# Patient Record
Sex: Female | Born: 1980 | Race: Black or African American | Hispanic: No | Marital: Single | Smoking: Former smoker
Health system: Southern US, Community
[De-identification: ages and names within clinical notes are randomized; demographics above are authoritative.]

## PROBLEM LIST (undated history)

## (undated) ENCOUNTER — Inpatient Hospital Stay (HOSPITAL_COMMUNITY): Payer: Self-pay

## (undated) DIAGNOSIS — A5901 Trichomonal vulvovaginitis: Secondary | ICD-10-CM

## (undated) DIAGNOSIS — N390 Urinary tract infection, site not specified: Secondary | ICD-10-CM

## (undated) DIAGNOSIS — D649 Anemia, unspecified: Secondary | ICD-10-CM

## (undated) DIAGNOSIS — R87619 Unspecified abnormal cytological findings in specimens from cervix uteri: Secondary | ICD-10-CM

## (undated) DIAGNOSIS — IMO0002 Reserved for concepts with insufficient information to code with codable children: Secondary | ICD-10-CM

## (undated) DIAGNOSIS — A749 Chlamydial infection, unspecified: Secondary | ICD-10-CM

## (undated) DIAGNOSIS — O24419 Gestational diabetes mellitus in pregnancy, unspecified control: Secondary | ICD-10-CM

## (undated) HISTORY — PX: WISDOM TOOTH EXTRACTION: SHX21

## (undated) HISTORY — PX: INDUCED ABORTION: SHX677

---

## 1998-07-12 ENCOUNTER — Emergency Department (HOSPITAL_COMMUNITY): Admission: EM | Admit: 1998-07-12 | Discharge: 1998-07-12 | Payer: Self-pay | Admitting: Emergency Medicine

## 2002-09-14 ENCOUNTER — Emergency Department (HOSPITAL_COMMUNITY): Admission: EM | Admit: 2002-09-14 | Discharge: 2002-09-14 | Payer: Self-pay | Admitting: *Deleted

## 2003-05-07 ENCOUNTER — Other Ambulatory Visit: Admission: RE | Admit: 2003-05-07 | Discharge: 2003-05-07 | Payer: Self-pay | Admitting: Obstetrics and Gynecology

## 2003-05-07 ENCOUNTER — Other Ambulatory Visit: Admission: RE | Admit: 2003-05-07 | Discharge: 2003-05-07 | Payer: Self-pay | Admitting: *Deleted

## 2003-09-24 ENCOUNTER — Encounter: Admission: RE | Admit: 2003-09-24 | Discharge: 2003-09-24 | Payer: Self-pay | Admitting: *Deleted

## 2003-10-03 ENCOUNTER — Inpatient Hospital Stay (HOSPITAL_COMMUNITY): Admission: AD | Admit: 2003-10-03 | Discharge: 2003-10-03 | Payer: Self-pay | Admitting: Obstetrics and Gynecology

## 2003-11-08 ENCOUNTER — Inpatient Hospital Stay (HOSPITAL_COMMUNITY): Admission: AD | Admit: 2003-11-08 | Discharge: 2003-11-12 | Payer: Self-pay | Admitting: Obstetrics & Gynecology

## 2003-12-10 ENCOUNTER — Other Ambulatory Visit: Admission: RE | Admit: 2003-12-10 | Discharge: 2003-12-10 | Payer: Self-pay | Admitting: *Deleted

## 2005-12-08 ENCOUNTER — Inpatient Hospital Stay (HOSPITAL_COMMUNITY): Admission: AD | Admit: 2005-12-08 | Discharge: 2005-12-08 | Payer: Self-pay

## 2006-10-22 ENCOUNTER — Inpatient Hospital Stay (HOSPITAL_COMMUNITY): Admission: AD | Admit: 2006-10-22 | Discharge: 2006-10-22 | Payer: Self-pay | Admitting: Gynecology

## 2008-10-17 ENCOUNTER — Inpatient Hospital Stay (HOSPITAL_COMMUNITY): Admission: AD | Admit: 2008-10-17 | Discharge: 2008-10-17 | Payer: Self-pay | Admitting: Obstetrics and Gynecology

## 2008-10-29 ENCOUNTER — Ambulatory Visit: Admission: RE | Admit: 2008-10-29 | Discharge: 2008-10-29 | Payer: Self-pay | Admitting: Obstetrics and Gynecology

## 2008-11-06 ENCOUNTER — Ambulatory Visit (HOSPITAL_COMMUNITY): Admission: RE | Admit: 2008-11-06 | Discharge: 2008-11-06 | Payer: Self-pay | Admitting: Obstetrics and Gynecology

## 2008-11-12 ENCOUNTER — Inpatient Hospital Stay (HOSPITAL_COMMUNITY): Admission: RE | Admit: 2008-11-12 | Discharge: 2008-11-14 | Payer: Self-pay | Admitting: Obstetrics and Gynecology

## 2008-11-12 ENCOUNTER — Encounter (INDEPENDENT_AMBULATORY_CARE_PROVIDER_SITE_OTHER): Payer: Self-pay | Admitting: Obstetrics and Gynecology

## 2009-02-02 ENCOUNTER — Emergency Department (HOSPITAL_COMMUNITY): Admission: EM | Admit: 2009-02-02 | Discharge: 2009-02-02 | Payer: Self-pay | Admitting: Emergency Medicine

## 2010-03-20 IMAGING — US US FETAL BPP W/O NONSTRESS
1 series · 14 of 17 positions shown · non-contrast
Comparison: none

OBSTETRICAL ULTRASOUND:
 This ultrasound exam was performed in the [HOSPITAL] Ultrasound Department.  The OB US report was generated in the AS system, and faxed to the ordering physician.  This report is also available in [REDACTED] PACS.

[Series 1: us fetal bpp w/o nonstress · non-contrast · 17 acquisitions, 14 frames shown]
[im 1/17]
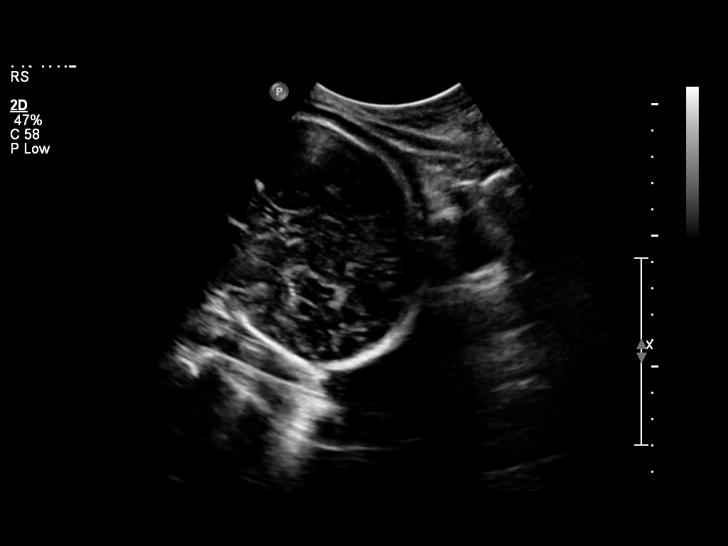
[im 2/17]
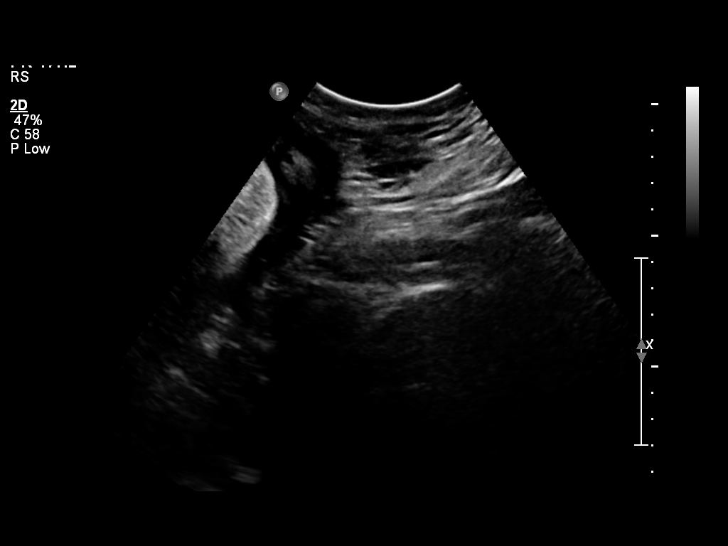
[im 4/17]
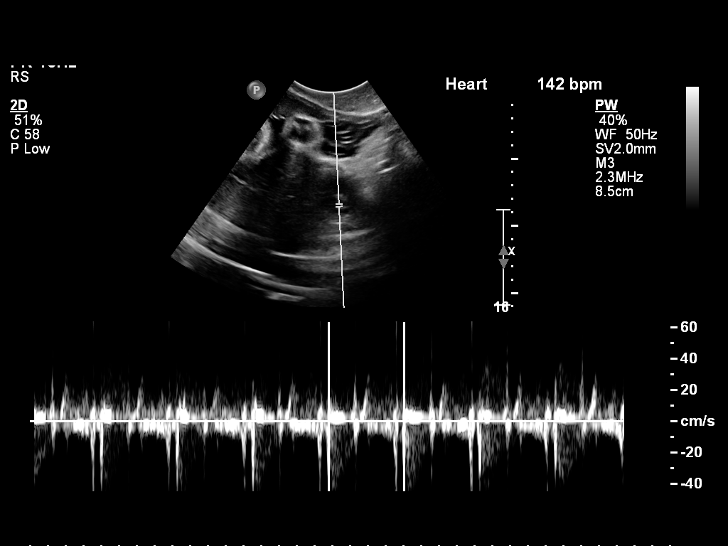
[im 5/17]
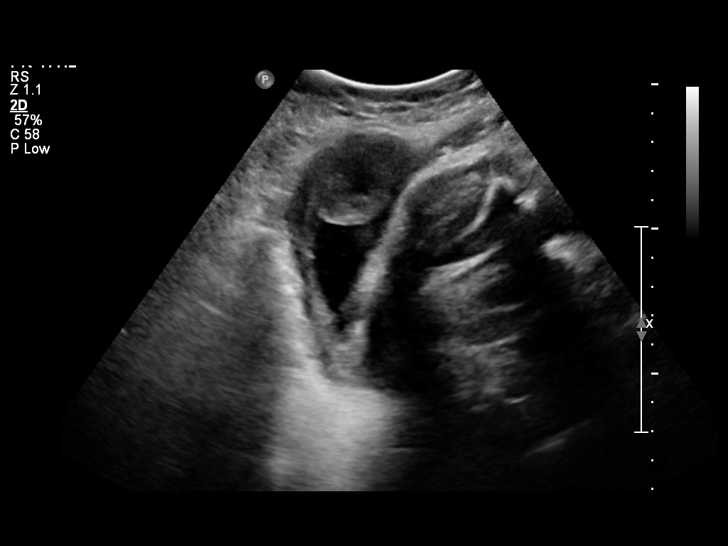
[im 6/17]
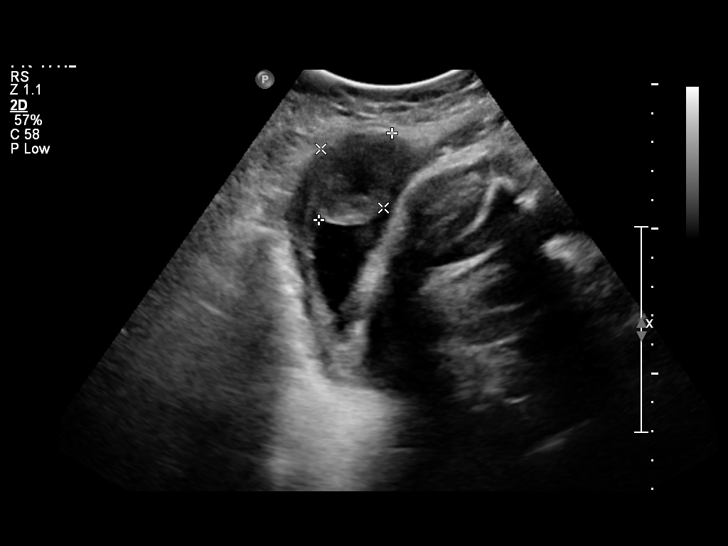
[im 7/17]
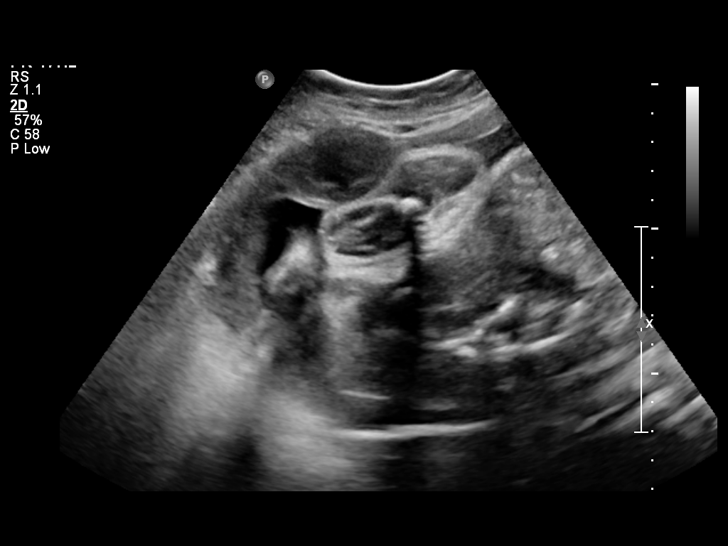
[im 8/17]
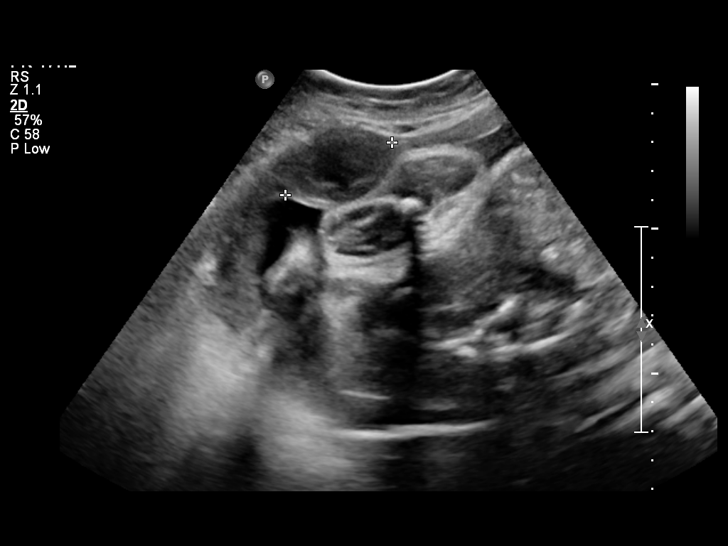
[im 10/17]
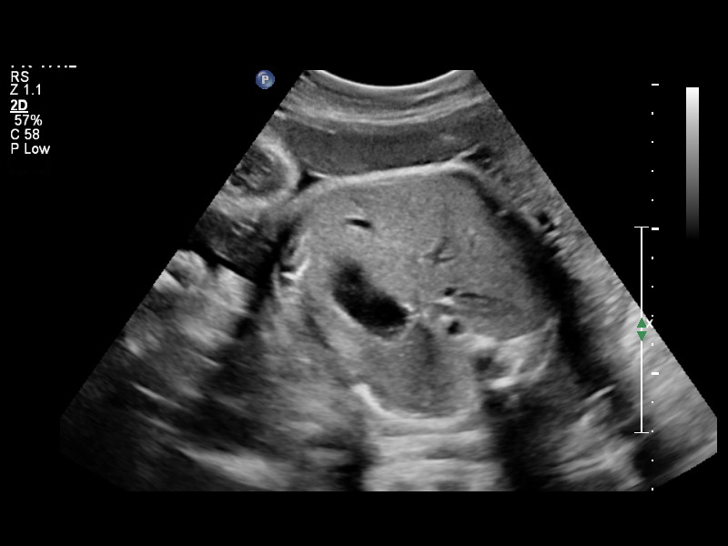
[im 11/17]
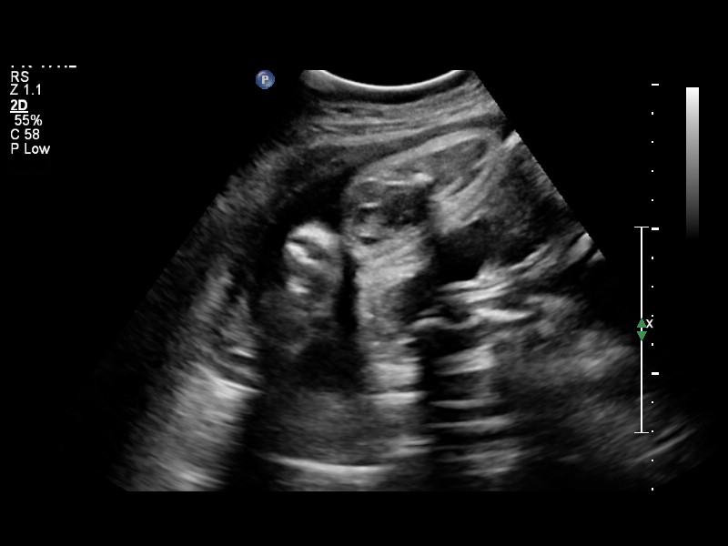
[im 12/17]
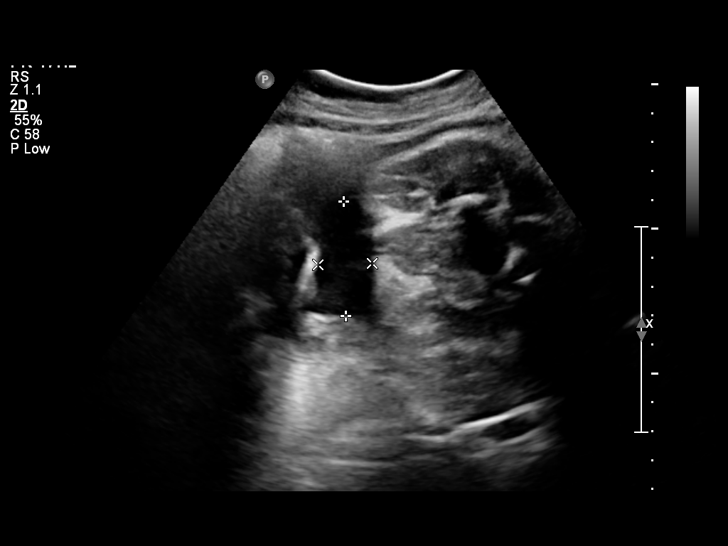
[im 13/17]
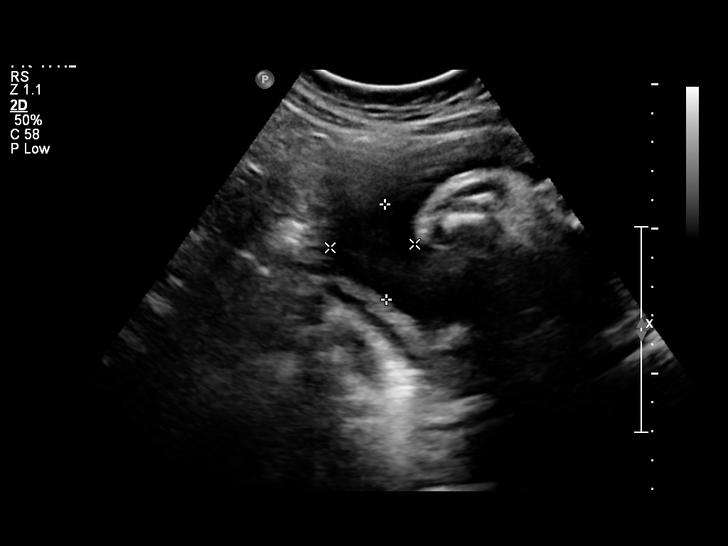
[im 14/17]
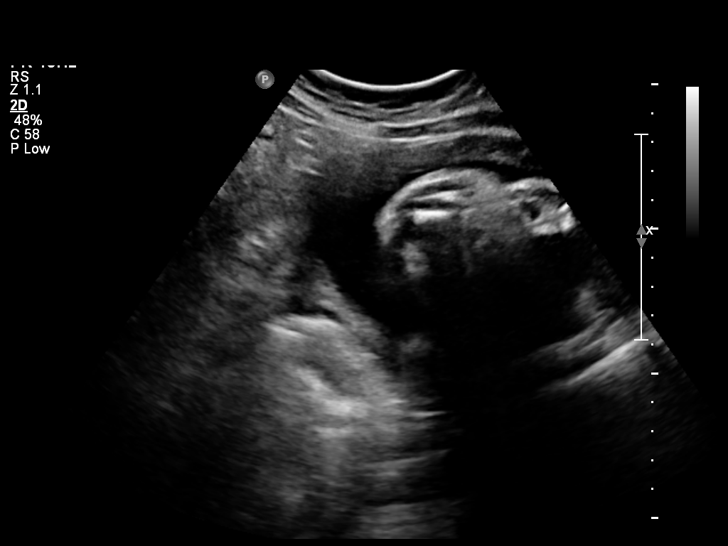
[im 16/17]
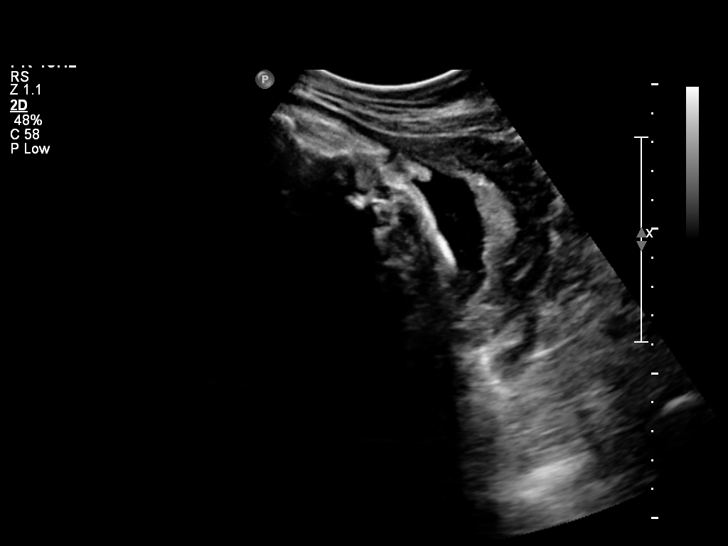
[im 17/17]
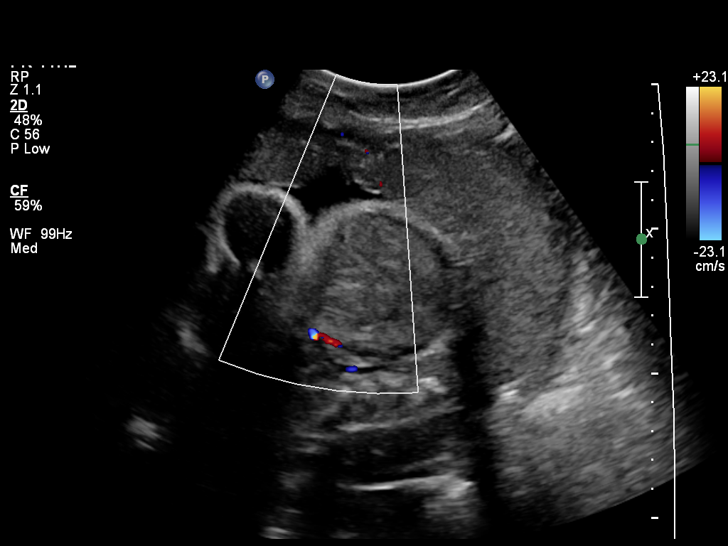

[14 of 17 positions shown; findings below may reference images not displayed]

IMPRESSION: See AS Obstetric US report.

## 2010-08-23 ENCOUNTER — Encounter: Payer: Self-pay | Admitting: Obstetrics and Gynecology

## 2010-11-11 LAB — CBC
HCT: 32.8 % — ABNORMAL LOW (ref 36.0–46.0)
MCHC: 33.8 g/dL (ref 30.0–36.0)
Platelets: 242 10*3/uL (ref 150–400)
RBC: 3.8 MIL/uL — ABNORMAL LOW (ref 3.87–5.11)
WBC: 10.4 10*3/uL (ref 4.0–10.5)
WBC: 8.1 10*3/uL (ref 4.0–10.5)

## 2010-11-11 LAB — RPR: RPR Ser Ql: NONREACTIVE

## 2010-12-15 NOTE — Discharge Summary (Signed)
NAMEBREDA, BOND                ACCOUNT NO.:  [id number]   MEDICAL RECORD NO.:  [id number]          PATIENT TYPE:  INP   LOCATION:  9114                          FACILITY:  [facility]   PHYSICIAN:  Zenaida Niece, M.D.DATE OF BIRTH:  03-05-1981   DATE OF ADMISSION:  11/12/2008  DATE OF DISCHARGE:  11/14/2008                               DISCHARGE SUMMARY   ADMISSION DIAGNOSES:  Intrauterine pregnancy at 39 weeks, possible  intrauterine growth restriction, and group B strep carrier.   DISCHARGE DIAGNOSES:  Intrauterine pregnancy at 39 weeks, possible  intrauterine growth restriction, and group B strep carrier.   PROCEDURES:  On November 12, 2008, she had a spontaneous vaginal delivery.   HISTORY AND PHYSICAL:  This is a 30 year old gravida 2, para 1-0-0-1  with an EGA of 39+ weeks, who presents for induction with possible IUGR.  She has been followed with NSTs which had been nonreactive, followed by  reassuring biophysical profiles and normal amniotic fluid volumes.   PRENATAL LABS:  RPR is nonreactive, hepatitis B surface antigen  negative, rubella immune, HIV negative, blood type is B positive with a  negative antibody screen, gonorrhea and chlamydia negative, 1-hour  Glucola 147, 3-hour GTT is normal, repeat 1-hour Glucola is 114, and  group B strep is positive.   PAST OBSTETRICAL HISTORY:  One vaginal delivery at 40 weeks, weighing 4  pounds 8 ounces, the pregnancy complicated by gestational diabetes,  controlled with diet.   PHYSICAL EXAMINATION:  She is afebrile with stable vital signs.  Fetal  heart tracing is initially category II with moderate variability 10 x 10  accelerations, no decelerations.  ABDOMEN:  Gravid, nontender with an estimated fetal weight of 5 pounds.  Cervix is 1+, 30, -3 vertex presentation, adequate pelvis.   HOSPITAL COURSE:  The patient was admitted and started on Pitocin  induction and penicillin for group B strep prophylaxis.  She progressed  to 4  cm and membranes ruptured revealing clear fluid.  Fetal heart  tracing remained reassuring.  She progressed to complete, pushed well on  the afternoon of November 12, 2008, had a vaginal delivery of a viable  female infant with Apgars of 9 and 9, weight 5 pounds 3 ounces.  A loose  nuchal cord x1 was reduced.  Placenta delivered spontaneously, was  intact, and was sent for cord blood donation.  Perineum had a few small  superficial abrasions which were hemostatic and not repaired.  Estimated  blood loss was less than 500 mL.  Postpartum, the patient had no  complications.  She remained afebrile.  Predelivery hemoglobin 11.6, and  postdelivery 11.2.  On postpartum #2, she was felt to be stable enough  for discharge home.   DISCHARGE INSTRUCTIONS:  Regular diet, pelvic rest, followup in 6 weeks.   MEDICATIONS:  1. Percocet #30, one to two p.o. q.4-6 h. p.r.n. pain.  2. Over-the-counter ibuprofen as needed.   She was given our discharge pamphlet.       Zenaida Niece, M.D.  Electronically Signed     TDM/MEDQ  D:  11/14/2008  T:  11/14/2008  Job:  [id number]

## 2010-12-18 NOTE — H&P (Signed)
Sherri Douglas, Sherri Douglas                            ACCOUNT NO.:  [id number]   MEDICAL RECORD NO.:  [id number]                   PATIENT TYPE:  INP   LOCATION:  9167                                 FACILITY:  [facility]   PHYSICIAN:  Gerri Spore B. Earlene Plater, M.D.               DATE OF BIRTH:  [birth year]   DATE OF ADMISSION:  11/08/2003  DATE OF DISCHARGE:                                HISTORY & PHYSICAL   ADMISSION DIAGNOSES:  1. Intrauterine pregnancy at 40 weeks.  2. Gestational diabetes (diet controlled).  3. Oligohydramnios.   HISTORY OF PRESENT ILLNESS:  A 30 year old African-American female (gravida  2, para 0, A1) at 40 weeks one day.  Noted today on follow-up ultrasound to  have oligohydramnios.  She had been checked two days earlier with an  absolute AFI of 6.8 and an estimated fetal weight of 6 pounds 13 ounces.  After two days of rest and hydration at home, recheck AFI today in the  office was down to 2.6 and the patient is admitted for induction of labor.   PRESENT PREGNANCY:  Prenatal care is by [facility], Dr. Marina Gravel.  Pregnancy is complicated by diet-controlled gestational diabetes, with  otherwise previously reassuring surveillance.  The patient's cervix is  unfavorable, and therefore presents for cervical ripening and subsequent  induction.   PAST MEDICAL HISTORY:  Otherwise negative.   PAST SURGICAL HISTORY:  Therapeutic abortion x1.   FAMILY HISTORY:  Noncontributory.   MEDICATIONS:  Prenatal vitamins.   ALLERGIES:  NONE.   SOCIAL HISTORY:  Noncontributory.   REVIEW OF SYSTEMS:  Otherwise negative.   PRENATAL LABS:  Blood type B positive, Rubella immune.  Hepatitis B, HIV and  RPR all negative.  Group B strep negative.   PHYSICAL EXAMINATION:  VITAL SIGNS:  Blood pressure 100/78, weight 164,  fetal heart tones 150.  GENERAL:  Alert, oriented in no acute distress.  SKIN:  Warm, dry and no lesions.  HEART:  Regular rate and rhythm.  LUNGS:  Clear to  auscultation.  ABDOMEN:  Liver and spleen normal.  No hernia.  Fundal height 39 cm.  PELVIC:  Cervix is flared at the external os; closed at the internal os, 50%  effaced, minus 1 station and vertex.   ASSESSMENT:  1. A 40-week intrauterine pregnancy.  2. Oligohydramnios.  3. Gestational diabetes, diet controlled.   PLAN:  Admission for cervical ripening and induction of labor.  Will follow  blood sugars in labor.                                               Gerri Spore B. Earlene Plater, M.D.    WBD/MEDQ  D:  11/08/2003  T:  11/08/2003  Job:  [id number]

## 2011-01-18 ENCOUNTER — Emergency Department (HOSPITAL_COMMUNITY)
Admission: EM | Admit: 2011-01-18 | Discharge: 2011-01-18 | Disposition: A | Discharge status: Home / Self Care | Payer: BC Managed Care – PPO | Attending: Emergency Medicine | Admitting: Emergency Medicine

## 2011-01-18 DIAGNOSIS — B370 Candidal stomatitis: Secondary | ICD-10-CM | POA: Insufficient documentation

## 2011-10-01 ENCOUNTER — Inpatient Hospital Stay (HOSPITAL_COMMUNITY)
Admission: AD | Admit: 2011-10-01 | Discharge: 2011-10-01 | Disposition: A | Discharge status: Home / Self Care | Payer: 59 | Source: Ambulatory Visit | Attending: Family Medicine | Admitting: Family Medicine

## 2011-10-01 ENCOUNTER — Encounter (HOSPITAL_COMMUNITY): Payer: Self-pay | Admitting: *Deleted

## 2011-10-01 DIAGNOSIS — A499 Bacterial infection, unspecified: Secondary | ICD-10-CM

## 2011-10-01 DIAGNOSIS — N760 Acute vaginitis: Secondary | ICD-10-CM | POA: Insufficient documentation

## 2011-10-01 DIAGNOSIS — B9689 Other specified bacterial agents as the cause of diseases classified elsewhere: Secondary | ICD-10-CM | POA: Insufficient documentation

## 2011-10-01 DIAGNOSIS — N949 Unspecified condition associated with female genital organs and menstrual cycle: Secondary | ICD-10-CM | POA: Insufficient documentation

## 2011-10-01 HISTORY — DX: Unspecified abnormal cytological findings in specimens from cervix uteri: R87.619

## 2011-10-01 HISTORY — DX: Gestational diabetes mellitus in pregnancy, unspecified control: O24.419

## 2011-10-01 HISTORY — DX: Reserved for concepts with insufficient information to code with codable children: IMO0002

## 2011-10-01 HISTORY — DX: Urinary tract infection, site not specified: N39.0

## 2011-10-01 HISTORY — DX: Chlamydial infection, unspecified: A74.9

## 2011-10-01 LAB — WET PREP, GENITAL

## 2011-10-01 MED ORDER — METRONIDAZOLE 500 MG PO TABS: 500.0000 mg | ORAL_TABLET | Freq: Two times a day (BID) | ORAL | Status: AC

## 2011-10-01 NOTE — Progress Notes (Signed)
'  odor' first noted 2-3 days. Does not see any discharge.  No pain. Denies any urinary symptoms.

## 2011-10-01 NOTE — ED Provider Notes (Signed)
History     Chief Complaint  Patient presents with  . Vaginal Discharge   HPI Pt is not pregnant and complains of vaginal odor for 2 days.  She had an abortion in December 7 @ [redacted]weeks gestation [employer] and started Loestrin 1/20.  She has not had sex since her abortion; last IC in November. She just finished a period.  She has a history of an infection that was initially diagnosed with a UTI and then didn't get relief and switched antibiotics and pt got fever, hives, chills.  She has a history of Chlamdyia about 3 years ago. She denies fever, chills, constipation, or diarrhea, cramping or abdominal pain.  Past Medical History  Diagnosis Date  . Gestational diabetes   . Urinary tract infection   . Abnormal Pap smear   . Chlamydia     Past Surgical History  Procedure Date  . Induced abortion     Family History  Problem Relation Age of Onset  . Anesthesia problems Neg Hx     History  Substance Use Topics  . Smoking status: Not on file  . Smokeless tobacco: Never Used  . Alcohol Use: No    Allergies:  Allergies  Allergen Reactions  . Amoxicillin Rash    Prescriptions prior to admission  Medication Sig Dispense Refill  . norethindrone-ethinyl estradiol (JUNEL FE,GILDESS FE,LOESTRIN FE) 1-20 MG-MCG tablet Take 1 tablet by mouth daily.        ROS Physical Exam   Blood pressure 129/90, pulse 79, temperature 97.7 F (36.5 C), temperature source Oral, resp. rate 16, height 4' 8.5" (1.435 m), weight 163 lb 6 oz (74.106 kg), last menstrual period 09/23/2011.  Physical Exam  Vitals reviewed. Constitutional: She is oriented to person, place, and time. She appears well-developed and well-nourished.  HENT:  Head: Normocephalic.  Eyes: Pupils are equal, round, and reactive to light.  Neck: Normal range of motion. Neck supple.  Cardiovascular: Normal rate.   Respiratory: Effort normal.  GI: Soft. She exhibits no distension. There is no tenderness. There  is no rebound and no guarding.  Genitourinary:       Large amount of malodorous yellow frothy discharge in vault; cervix clean parous, uterus NSSC NT; adnexa without palpable enlargement or tenderness  Musculoskeletal: Normal range of motion.  Neurological: She is alert and oriented to person, place, and time.  Skin: Skin is warm and dry.  Psychiatric: She has a normal mood and affect.    MAU Course  Procedures Results for orders placed during the hospital encounter of 10/01/11 (from the past 24 hour(s))  WET PREP, GENITAL     Status: Abnormal   Collection Time   10/01/11  5:10 PM      Component Value Range   Yeast Wet Prep HPF POC NONE SEEN  NONE SEEN    Trich, Wet Prep NONE SEEN  NONE SEEN    Clue Cells Wet Prep HPF POC FEW (*) NONE SEEN    WBC, Wet Prep HPF POC MANY (*) NONE SEEN   GC/chlamydia - pending  Assessment and Plan  Bacterial vaginosis- prescription for Flagyl Borderline BP- f/u with provider  [facility] 10/01/2011, 4:57 PM

## 2011-10-01 NOTE — Discharge Instructions (Signed)
Bacterial Vaginosis Bacterial vaginosis (BV) is a vaginal infection where the normal balance of bacteria in the vagina is disrupted. The normal balance is then replaced by an overgrowth of certain bacteria. There are several different kinds of bacteria that can cause BV. BV is the most common vaginal infection in women of childbearing age. CAUSES   The cause of BV is not fully understood. BV develops when there is an increase or imbalance of harmful bacteria.   Some activities or behaviors can upset the normal balance of bacteria in the vagina and put women at increased risk including:   Having a new sex partner or multiple sex partners.   Douching.   Using an intrauterine device (IUD) for contraception.   It is not clear what role sexual activity plays in the development of BV. However, women that have never had sexual intercourse are rarely infected with BV.  Women do not get BV from toilet seats, bedding, swimming pools or from touching objects around them.  SYMPTOMS   Grey vaginal discharge.   A fish-like odor with discharge, especially after sexual intercourse.   Itching or burning of the vagina and vulva.   Burning or pain with urination.   Some women have no signs or symptoms at all.  DIAGNOSIS  Your caregiver must examine the vagina for signs of BV. Your caregiver will perform lab tests and look at the sample of vaginal fluid through a microscope. They will look for bacteria and abnormal cells (clue cells), a pH test higher than 4.5, and a positive amine test all associated with BV.  RISKS AND COMPLICATIONS   Pelvic inflammatory disease (PID).   Infections following gynecology surgery.   Developing HIV.   Developing herpes virus.  TREATMENT  Sometimes BV will clear up without treatment. However, all women with symptoms of BV should be treated to avoid complications, especially if gynecology surgery is planned. Female partners generally do not need to be treated. However,  BV may spread between female sex partners so treatment is helpful in preventing a recurrence of BV.   BV may be treated with antibiotics. The antibiotics come in either pill or vaginal cream forms. Either can be used with nonpregnant or pregnant women, but the recommended dosages differ. These antibiotics are not harmful to the baby.   BV can recur after treatment. If this happens, a second round of antibiotics will often be prescribed.   Treatment is important for pregnant women. If not treated, BV can cause a premature delivery, especially for a pregnant woman who had a premature birth in the past. All pregnant women who have symptoms of BV should be checked and treated.   For chronic reoccurrence of BV, treatment with a type of prescribed gel vaginally twice a week is helpful.  HOME CARE INSTRUCTIONS   Finish all medication as directed by your caregiver.   Do not have sex until treatment is completed.   Tell your sexual partner that you have a vaginal infection. They should see their caregiver and be treated if they have problems, such as a mild rash or itching.   Practice safe sex. Use condoms. Only have 1 sex partner.  PREVENTION  Basic prevention steps can help reduce the risk of upsetting the natural balance of bacteria in the vagina and developing BV:  Do not have sexual intercourse (be abstinent).   Do not douche.   Use all of the medicine prescribed for treatment of BV, even if the signs and symptoms go away.     Tell your sex partner if you have BV. That way, they can be treated, if needed, to prevent reoccurrence.  SEEK MEDICAL CARE IF:   Your symptoms are not improving after 3 days of treatment.   You have increased discharge, pain, or fever.  MAKE SURE YOU:   Understand these instructions.   Will watch your condition.   Will get help right away if you are not doing well or get worse.  FOR MORE INFORMATION  Division of STD Prevention (DSTDP), Centers for Disease  Control and Prevention: www.cdc.gov/std American Social Health Association (ASHA): www.ashastd.org  Document Released: 07/19/2005 Document Revised: 03/31/2011 Document Reviewed: 01/09/2009 ExitCare Patient Information 2012 [employer]. 

## 2011-10-01 NOTE — Progress Notes (Signed)
Pt states, " I had a foul vaginal odor for three days, but no discharge or bleeding."

## 2011-10-10 NOTE — ED Provider Notes (Signed)
Chart reviewed and agree with management and plan.  

## 2011-12-30 ENCOUNTER — Encounter (HOSPITAL_COMMUNITY): Payer: Self-pay | Admitting: *Deleted

## 2011-12-30 ENCOUNTER — Inpatient Hospital Stay (HOSPITAL_COMMUNITY)
Admission: AD | Admit: 2011-12-30 | Discharge: 2011-12-30 | Disposition: A | Discharge status: Home / Self Care | Payer: 59 | Source: Ambulatory Visit | Attending: Obstetrics & Gynecology | Admitting: Obstetrics & Gynecology

## 2011-12-30 DIAGNOSIS — L293 Anogenital pruritus, unspecified: Secondary | ICD-10-CM | POA: Insufficient documentation

## 2011-12-30 DIAGNOSIS — B373 Candidiasis of vulva and vagina: Secondary | ICD-10-CM

## 2011-12-30 DIAGNOSIS — B3731 Acute candidiasis of vulva and vagina: Secondary | ICD-10-CM

## 2011-12-30 LAB — WET PREP, GENITAL: Clue Cells Wet Prep HPF POC: NONE SEEN

## 2011-12-30 MED ORDER — FLUCONAZOLE 150 MG PO TABS
150.0000 mg | ORAL_TABLET | Freq: Once | ORAL | Status: AC
  Administered 2011-12-30: 150 mg via ORAL
  Filled 2011-12-30: qty 1

## 2011-12-30 MED ORDER — FLUCONAZOLE 150 MG PO TABS: 150.0000 mg | ORAL_TABLET | Freq: Once | ORAL | Status: AC

## 2011-12-30 NOTE — MAU Provider Note (Signed)
History     CSN: [id number]  Arrival date & time 12/30/11  1030   None     No chief complaint on file.   HPI Sherri Douglas is a 31 y.o. female who present to MAU for vaginal itching and discharge. The patient is not pregnant. The symptoms started one week ago. She had been on antibiotics a few weeks ago. She denies any other problems.  Past Medical History  Diagnosis Date  . Gestational diabetes   . Urinary tract infection   . Abnormal Pap smear   . Chlamydia     Past Surgical History  Procedure Date  . Induced abortion     Family History  Problem Relation Age of Onset  . Anesthesia problems Neg Hx   . Diabetes Father   . Hypertension Father   . Diabetes Paternal Aunt   . Hypertension Paternal Aunt   . Diabetes Paternal Grandmother   . Hypertension Paternal Grandmother   . Diabetes Paternal Grandfather   . Hypertension Paternal Grandfather     History  Substance Use Topics  . Smoking status: Never Smoker   . Smokeless tobacco: Never Used  . Alcohol Use: No    OB History    Grav Para Term Preterm Abortions TAB SAB Ect Mult Living   3 2 2  0 1 1 0 0 0 2      Review of Systems As stated in HPI  Allergies  Amoxicillin  Home Medications  No current outpatient prescriptions on file.  BP 122/85  Pulse 78  Temp(Src) 98.6 F (37 C) (Oral)  Resp 16  Ht 4' 9.5" (1.461 m)  Wt 165 lb 6.4 oz (75.025 kg)  BMI 35.17 kg/m2  SpO2 100%  LMP 12/10/2011  Physical Exam: abdomen soft, non tender with palpation. External genitalia without lesions, thick white discharge vaginal vault. No CMT, no adnexal tenderness or mass palpated. Uterus without palpable enlargement.   Assessment: Monilia vaginitis   Plan:  Diflucan 150 mg po   [facility]   Return here as needed.  ED Course  Procedures  MDM

## 2011-12-30 NOTE — MAU Note (Signed)
Patient states she has had slight vaginal itching with a scant discharge for a couple of days. No pain or bleeding.

## 2011-12-30 NOTE — Discharge Instructions (Signed)
Candidal Vulvovaginitis Candidal vulvovaginitis is an infection of the vagina and vulva. The vulva is the skin around the opening of the vagina. This may cause itching and discomfort in and around the vagina.  HOME CARE  Only take medicine as told by your doctor.   Do not have sex (intercourse) until the infection is healed or as told by your doctor.   Practice safe sex.   Tell your sex partner about your infection.   Do not douche or use tampons.   Wear cotton underwear. Do not wear tight pants or panty hose.   Eat yogurt. This may help treat and prevent yeast infections.  GET HELP RIGHT AWAY IF:   You have a fever.   Your problems get worse during treatment or do not get better in 3 days.   You have discomfort, irritation, or itching in your vagina or vulva area.   You have pain after sex.   You start to get belly (abdominal) pain.  MAKE SURE YOU:  Understand these instructions.   Will watch your condition.   Will get help right away if you are not doing well or get worse.  Document Released: 10/15/2008 Document Revised: 07/08/2011 Document Reviewed: 10/15/2008 ExitCare Patient Information 2012 [employer]. 

## 2011-12-31 LAB — GC/CHLAMYDIA PROBE AMP, GENITAL: GC Probe Amp, Genital: NEGATIVE

## 2012-01-03 NOTE — MAU Provider Note (Signed)
Medical Screening exam and patient care preformed by advanced practice provider.  Agree with the above management.  

## 2013-08-02 DIAGNOSIS — A5901 Trichomonal vulvovaginitis: Secondary | ICD-10-CM

## 2013-08-02 HISTORY — DX: Trichomonal vulvovaginitis: A59.01

## 2014-03-11 ENCOUNTER — Inpatient Hospital Stay (HOSPITAL_COMMUNITY)
Admission: AD | Admit: 2014-03-11 | Discharge: 2014-03-11 | Disposition: A | Discharge status: Home / Self Care | Payer: 59 | Source: Ambulatory Visit | Attending: Obstetrics & Gynecology | Admitting: Obstetrics & Gynecology

## 2014-03-11 ENCOUNTER — Encounter (HOSPITAL_COMMUNITY): Payer: Self-pay | Admitting: *Deleted

## 2014-03-11 DIAGNOSIS — A599 Trichomoniasis, unspecified: Secondary | ICD-10-CM

## 2014-03-11 DIAGNOSIS — A5901 Trichomonal vulvovaginitis: Secondary | ICD-10-CM | POA: Insufficient documentation

## 2014-03-11 DIAGNOSIS — R109 Unspecified abdominal pain: Secondary | ICD-10-CM | POA: Insufficient documentation

## 2014-03-11 LAB — URINALYSIS, ROUTINE W REFLEX MICROSCOPIC
BILIRUBIN URINE: NEGATIVE
GLUCOSE, UA: NEGATIVE mg/dL
KETONES UR: 15 mg/dL — AB
Nitrite: NEGATIVE
PH: 7.5 (ref 5.0–8.0)
PROTEIN: NEGATIVE mg/dL
Specific Gravity, Urine: 1.02 (ref 1.005–1.030)
Urobilinogen, UA: 4 mg/dL — ABNORMAL HIGH (ref 0.0–1.0)

## 2014-03-11 LAB — WET PREP, GENITAL
CLUE CELLS WET PREP: NONE SEEN
Yeast Wet Prep HPF POC: NONE SEEN

## 2014-03-11 LAB — URINE MICROSCOPIC-ADD ON

## 2014-03-11 LAB — POCT PREGNANCY, URINE: PREG TEST UR: NEGATIVE

## 2014-03-11 MED ORDER — METRONIDAZOLE 500 MG PO TABS: 2000.0000 mg | ORAL_TABLET | Freq: Once | ORAL | Status: DC

## 2014-03-11 NOTE — MAU Provider Note (Signed)
None     Chief Complaint:  Abdominal Pain and Vaginitis   Lenetta Piche is  33 y.o. [id number].  Patient's last menstrual period was 03/07/2014.Marland Kitchen  Her pregnancy status is negative.  She presents complaining of Abdominal Pain and Vaginitis She had a normal period which ended 2 days ago, but it was especially crampy and the cramps are still there.  She began having internal vaginal itching 2 4 days ago, but hasn't seen any unusual discharge.  Requests a pregnancy test.  Past Medical History  Diagnosis Date  . Gestational diabetes   . Urinary tract infection   . Abnormal Pap smear   . Chlamydia     Past Surgical History  Procedure Laterality Date  . Induced abortion      Family History  Problem Relation Age of Onset  . Anesthesia problems Neg Hx   . Diabetes Father   . Hypertension Father   . Diabetes Paternal Aunt   . Hypertension Paternal Aunt   . Diabetes Paternal Grandmother   . Hypertension Paternal Grandmother   . Diabetes Paternal Grandfather   . Hypertension Paternal Grandfather     History  Substance Use Topics  . Smoking status: Never Smoker   . Smokeless tobacco: Never Used  . Alcohol Use: No    Allergies:  Allergies  Allergen Reactions  . Amoxicillin Rash    Prescriptions prior to admission  Medication Sig Dispense Refill  . acetaminophen (TYLENOL) 500 MG tablet Take 1,000 mg by mouth daily as needed for headache (for cramping.).      Marland Kitchen naproxen sodium (ANAPROX) 220 MG tablet Take 220 mg by mouth daily as needed (for cramping.).         Review of Systems   Constitutional: Negative for fever and chills Eyes: Negative for visual disturbances Respiratory: Negative for shortness of breath, dyspnea Cardiovascular: Negative for chest pain or palpitations  Gastrointestinal: Negative for vomiting, diarrhea and constipation Genitourinary: Negative for dysuria and urgency Musculoskeletal: Negative for back pain, joint pain, myalgias  Neurological: Negative  for dizziness and headaches     Physical Exam   Blood pressure 122/78, pulse 91, temperature 98.7 F (37.1 C), temperature source Oral, resp. rate 18, height 4\' 11"  (1.499 m), weight 71.124 kg (156 lb 12.8 oz), last menstrual period 03/07/2014.  General: General appearance - alert, well appearing, and in no distress Chest - clear to auscultation, no wheezes, rales or rhonchi, symmetric air entry Heart - normal rate and regular rhythm Abdomen - soft, nontendetr Pelvic - SSE: white frothy vaginal discharge,  Vaginal pink without erythema Extremities - no pedal edema noted   Labs: Results for orders placed during the hospital encounter of 03/11/14 (from the past 24 hour(s))  URINALYSIS, ROUTINE W REFLEX MICROSCOPIC   Collection Time    03/11/14  4:30 PM      Result Value Ref Range   Color, Urine YELLOW  YELLOW   APPearance HAZY (*) CLEAR   Specific Gravity, Urine 1.020  1.005 - 1.030   pH 7.5  5.0 - 8.0   Glucose, UA NEGATIVE  NEGATIVE mg/dL   Hgb urine dipstick TRACE (*) NEGATIVE   Bilirubin Urine NEGATIVE  NEGATIVE   Ketones, ur 15 (*) NEGATIVE mg/dL   Protein, ur NEGATIVE  NEGATIVE mg/dL   Urobilinogen, UA 4.0 (*) 0.0 - 1.0 mg/dL   Nitrite NEGATIVE  NEGATIVE   Leukocytes, UA LARGE (*) NEGATIVE  URINE MICROSCOPIC-ADD ON   Collection Time    03/11/14  4:30 PM  Result Value Ref Range   Squamous Epithelial / LPF FEW (*) RARE   WBC, UA 21-50  <3 WBC/hpf   Urine-Other TRICHOMONAS PRESENT    WET PREP, GENITAL   Collection Time    03/11/14  4:42 PM      Result Value Ref Range   Yeast Wet Prep HPF POC NONE SEEN  NONE SEEN   Trich, Wet Prep FEW (*) NONE SEEN   Clue Cells Wet Prep HPF POC NONE SEEN  NONE SEEN   WBC, Wet Prep HPF POC MODERATE (*) NONE SEEN  POCT PREGNANCY, URINE   Collection Time    03/11/14  4:49 PM      Result Value Ref Range   Preg Test, Ur NEGATIVE  NEGATIVE   Imaging Studies:  No results found.   Assessment: Trichimonas: pt doesn't want her  current partner to know  Plan: Rx for flagyl with one refill sent to pharmacy.  Pt knows that all of her partners need to be treated  CRESENZO-DISHMAN,Vickii Volland

## 2014-03-11 NOTE — MAU Note (Signed)
Just finished period, had excruciating cramping, has vaginal irritation & irritation, but no discharge.  Thinks she could be pregnant.

## 2014-03-11 NOTE — Discharge Instructions (Signed)

## 2014-03-12 LAB — GC/CHLAMYDIA PROBE AMP
CT PROBE, AMP APTIMA: NEGATIVE
GC PROBE AMP APTIMA: NEGATIVE

## 2014-03-19 ENCOUNTER — Inpatient Hospital Stay (HOSPITAL_COMMUNITY)
Admission: AD | Admit: 2014-03-19 | Discharge: 2014-03-19 | Disposition: A | Discharge status: Home / Self Care | Payer: 59 | Source: Ambulatory Visit | Attending: Obstetrics & Gynecology | Admitting: Obstetrics & Gynecology

## 2014-03-19 ENCOUNTER — Encounter (HOSPITAL_COMMUNITY): Payer: Self-pay | Admitting: *Deleted

## 2014-03-19 DIAGNOSIS — A5901 Trichomonal vulvovaginitis: Secondary | ICD-10-CM | POA: Insufficient documentation

## 2014-03-19 DIAGNOSIS — F17200 Nicotine dependence, unspecified, uncomplicated: Secondary | ICD-10-CM | POA: Insufficient documentation

## 2014-03-19 DIAGNOSIS — R109 Unspecified abdominal pain: Secondary | ICD-10-CM | POA: Insufficient documentation

## 2014-03-19 DIAGNOSIS — Z833 Family history of diabetes mellitus: Secondary | ICD-10-CM | POA: Insufficient documentation

## 2014-03-19 DIAGNOSIS — Z8249 Family history of ischemic heart disease and other diseases of the circulatory system: Secondary | ICD-10-CM | POA: Insufficient documentation

## 2014-03-19 HISTORY — DX: Trichomonal vulvovaginitis: A59.01

## 2014-03-19 LAB — URINALYSIS, ROUTINE W REFLEX MICROSCOPIC
BILIRUBIN URINE: NEGATIVE
Glucose, UA: NEGATIVE mg/dL
HGB URINE DIPSTICK: NEGATIVE
Ketones, ur: 15 mg/dL — AB
LEUKOCYTES UA: NEGATIVE
Nitrite: NEGATIVE
PH: 6 (ref 5.0–8.0)
Protein, ur: NEGATIVE mg/dL
Specific Gravity, Urine: 1.025 (ref 1.005–1.030)
Urobilinogen, UA: 0.2 mg/dL (ref 0.0–1.0)

## 2014-03-19 LAB — WET PREP, GENITAL
Clue Cells Wet Prep HPF POC: NONE SEEN
TRICH WET PREP: NONE SEEN
Yeast Wet Prep HPF POC: NONE SEEN

## 2014-03-19 LAB — POCT PREGNANCY, URINE: Preg Test, Ur: NEGATIVE

## 2014-03-19 MED ORDER — METRONIDAZOLE 500 MG PO TABS
2000.0000 mg | ORAL_TABLET | Freq: Once | ORAL | Status: AC
  Administered 2014-03-19: 2000 mg via ORAL
  Filled 2014-03-19: qty 4

## 2014-03-19 NOTE — MAU Provider Note (Signed)
History     CSN: [id number]  Arrival date and time: 03/19/14 1624   None     Chief Complaint  Patient presents with  . Abdominal Cramping   HPI 33 y.o. [id number] with abdominal cramping ongoing since 03/08/17, also having discharge and itching. Seen in MAU on 03/11/14, treated for Trich at that time, vomited some of the Flagyl at home, took a second dose. Partner was then treated on 8/12, had intercourse on 8/15.   Past Medical History  Diagnosis Date  . Urinary tract infection   . Abnormal Pap smear   . Chlamydia   . Gestational diabetes   . Trichomoniasis of vagina 2015    Past Surgical History  Procedure Laterality Date  . Induced abortion    . Wisdom tooth extraction      Family History  Problem Relation Age of Onset  . Anesthesia problems Neg Hx   . Diabetes Father   . Hypertension Father   . Diabetes Paternal Aunt   . Hypertension Paternal Aunt   . Diabetes Paternal Grandmother   . Hypertension Paternal Grandmother   . Diabetes Paternal Grandfather   . Hypertension Paternal Grandfather     History  Substance Use Topics  . Smoking status: Current Some Day Smoker  . Smokeless tobacco: Never Used  . Alcohol Use: Yes     Comment: socially    Allergies:  Allergies  Allergen Reactions  . Amoxicillin Rash    Prescriptions prior to admission  Medication Sig Dispense Refill  . acetaminophen (TYLENOL) 500 MG tablet Take 1,000 mg by mouth daily as needed for headache (for cramping.).      Marland Kitchen metroNIDAZOLE (FLAGYL) 500 MG tablet Take 4 tablets (2,000 mg total) by mouth once.  4 tablet  1  . naproxen sodium (ANAPROX) 220 MG tablet Take 220 mg by mouth daily as needed (for cramping.).        Review of Systems  Constitutional: Negative.   Respiratory: Negative.   Cardiovascular: Negative.   Gastrointestinal: Negative for nausea, vomiting, abdominal pain, diarrhea and constipation.  Genitourinary: Negative for dysuria, urgency, frequency, hematuria and flank  pain.       Positive for cramping and d/c  Musculoskeletal: Negative.   Neurological: Negative.   Psychiatric/Behavioral: Negative.    Physical Exam   Blood pressure 121/80, pulse 86, temperature 98.7 F (37.1 C), temperature source Oral, resp. rate 16, height 4' 10.5" (1.486 m), weight 154 lb 3.2 oz (69.945 kg), last menstrual period 03/07/2014, SpO2 100.00%.  Physical Exam  Nursing note and vitals reviewed. Constitutional: She is oriented to person, place, and time. She appears well-developed and well-nourished. No distress.  Cardiovascular: Normal rate.   Respiratory: Effort normal.  Genitourinary: Uterus is not tender. Cervix exhibits no motion tenderness. Right adnexum displays no mass, no tenderness and no fullness. Left adnexum displays no mass, no tenderness and no fullness. Vaginal discharge (copius thin white) found.  Musculoskeletal: Normal range of motion.  Neurological: She is alert and oriented to person, place, and time.  Skin: Skin is warm and dry.  Psychiatric: She has a normal mood and affect.    MAU Course  Procedures  Results for orders placed during the hospital encounter of 03/19/14 (from the past 24 hour(s))  URINALYSIS, ROUTINE W REFLEX MICROSCOPIC     Status: Abnormal   Collection Time    03/19/14  5:20 PM      Result Value Ref Range   Color, Urine YELLOW  YELLOW   APPearance  CLEAR  CLEAR   Specific Gravity, Urine 1.025  1.005 - 1.030   pH 6.0  5.0 - 8.0   Glucose, UA NEGATIVE  NEGATIVE mg/dL   Hgb urine dipstick NEGATIVE  NEGATIVE   Bilirubin Urine NEGATIVE  NEGATIVE   Ketones, ur 15 (*) NEGATIVE mg/dL   Protein, ur NEGATIVE  NEGATIVE mg/dL   Urobilinogen, UA 0.2  0.0 - 1.0 mg/dL   Nitrite NEGATIVE  NEGATIVE   Leukocytes, UA NEGATIVE  NEGATIVE  POCT PREGNANCY, URINE     Status: None   Collection Time    03/19/14  6:51 PM      Result Value Ref Range   Preg Test, Ur NEGATIVE  NEGATIVE   Flagyl 2000 mg given in MAU for presumed reinfection with  Trich  Assessment and Plan   1. Trichomoniasis of vagina       Medication List    STOP taking these medications       metroNIDAZOLE 500 MG tablet  Commonly known as:  FLAGYL      TAKE these medications       acetaminophen 500 MG tablet  Commonly known as:  TYLENOL  Take 1,000 mg by mouth daily as needed for headache (for cramping.).     naproxen sodium 220 MG tablet  Commonly known as:  ANAPROX  Take 220 mg by mouth daily as needed (for cramping.).        Follow-up Information   Follow up with [facility] (As needed)    Specialty:  Obstetrics and Gynecology   Contact information:   [place] [address] [phone]        [facility] 03/19/2014, 10:41 PM

## 2014-03-19 NOTE — Discharge Instructions (Signed)

## 2014-03-19 NOTE — MAU Note (Signed)
Patient states she was seen in MAU about one week ago and treated for a bacterial infection. States she she continues to have the same abdominal cramping she had before she was treated. Denies bleeding and has a little vaginal discharge.

## 2014-03-19 NOTE — MAU Provider Note (Signed)
Attestation of Attending Supervision of Advanced Practitioner (PA/CNM/NP): Evaluation and management procedures were performed by the Advanced Practitioner under my supervision and collaboration.  I have reviewed the Advanced Practitioner's note and chart, and I agree with the management and plan.  Zeven Kocak, MD, FACOG Attending Obstetrician & Gynecologist Faculty Practice, [facility]   

## 2014-03-28 ENCOUNTER — Inpatient Hospital Stay (HOSPITAL_COMMUNITY)
Admission: AD | Admit: 2014-03-28 | Discharge: 2014-03-28 | Discharge status: Against Medical Advice | Payer: 59 | Source: Ambulatory Visit | Attending: Obstetrics & Gynecology | Admitting: Obstetrics & Gynecology

## 2014-03-28 NOTE — MAU Note (Signed)
Patient is not in the lobby when called to triage. Registration states the patient told them she was leaving.

## 2014-06-03 ENCOUNTER — Encounter (HOSPITAL_COMMUNITY): Payer: Self-pay | Admitting: *Deleted

## 2015-04-10 ENCOUNTER — Emergency Department (HOSPITAL_BASED_OUTPATIENT_CLINIC_OR_DEPARTMENT_OTHER)
Admission: EM | Admit: 2015-04-10 | Discharge: 2015-04-10 | Disposition: A | Discharge status: Home / Self Care | Payer: BC Managed Care – PPO | Attending: Emergency Medicine | Admitting: Emergency Medicine

## 2015-04-10 ENCOUNTER — Encounter (HOSPITAL_BASED_OUTPATIENT_CLINIC_OR_DEPARTMENT_OTHER): Payer: Self-pay | Admitting: Emergency Medicine

## 2015-04-10 ENCOUNTER — Emergency Department (HOSPITAL_BASED_OUTPATIENT_CLINIC_OR_DEPARTMENT_OTHER): Payer: BC Managed Care – PPO

## 2015-04-10 DIAGNOSIS — Z8632 Personal history of gestational diabetes: Secondary | ICD-10-CM | POA: Diagnosis not present

## 2015-04-10 DIAGNOSIS — Z87440 Personal history of urinary (tract) infections: Secondary | ICD-10-CM | POA: Insufficient documentation

## 2015-04-10 DIAGNOSIS — R0789 Other chest pain: Secondary | ICD-10-CM

## 2015-04-10 DIAGNOSIS — Z880 Allergy status to penicillin: Secondary | ICD-10-CM | POA: Insufficient documentation

## 2015-04-10 DIAGNOSIS — Z720 Tobacco use: Secondary | ICD-10-CM | POA: Diagnosis not present

## 2015-04-10 DIAGNOSIS — Z8619 Personal history of other infectious and parasitic diseases: Secondary | ICD-10-CM | POA: Diagnosis not present

## 2015-04-10 DIAGNOSIS — R079 Chest pain, unspecified: Secondary | ICD-10-CM | POA: Diagnosis present

## 2015-04-10 LAB — BASIC METABOLIC PANEL
ANION GAP: 7 (ref 5–15)
BUN: 14 mg/dL (ref 6–20)
CALCIUM: 8.9 mg/dL (ref 8.9–10.3)
CO2: 22 mmol/L (ref 22–32)
Chloride: 105 mmol/L (ref 101–111)
Creatinine, Ser: 0.59 mg/dL (ref 0.44–1.00)
Glucose, Bld: 90 mg/dL (ref 65–99)
POTASSIUM: 4 mmol/L (ref 3.5–5.1)
SODIUM: 134 mmol/L — AB (ref 135–145)

## 2015-04-10 LAB — CBC WITH DIFFERENTIAL/PLATELET
BASOS ABS: 0 10*3/uL (ref 0.0–0.1)
Basophils Relative: 0 % (ref 0–1)
EOS ABS: 0.1 10*3/uL (ref 0.0–0.7)
Eosinophils Relative: 1 % (ref 0–5)
HCT: 30.2 % — ABNORMAL LOW (ref 36.0–46.0)
Hemoglobin: 8.6 g/dL — ABNORMAL LOW (ref 12.0–15.0)
LYMPHS ABS: 2.5 10*3/uL (ref 0.7–4.0)
Lymphocytes Relative: 44 % (ref 12–46)
MCH: 19.9 pg — ABNORMAL LOW (ref 26.0–34.0)
MCHC: 28.5 g/dL — ABNORMAL LOW (ref 30.0–36.0)
MCV: 69.7 fL — ABNORMAL LOW (ref 78.0–100.0)
MONO ABS: 0.3 10*3/uL (ref 0.1–1.0)
Monocytes Relative: 5 % (ref 3–12)
NEUTROS PCT: 50 % (ref 43–77)
Neutro Abs: 2.7 10*3/uL (ref 1.7–7.7)
PLATELETS: 433 10*3/uL — AB (ref 150–400)
RBC: 4.33 MIL/uL (ref 3.87–5.11)
RDW: 17.3 % — AB (ref 11.5–15.5)
WBC: 5.6 10*3/uL (ref 4.0–10.5)

## 2015-04-10 LAB — TROPONIN I

## 2015-04-10 LAB — SEDIMENTATION RATE: SED RATE: 11 mm/h (ref 0–22)

## 2015-04-10 MED ORDER — IBUPROFEN 600 MG PO TABS: 600.0000 mg | ORAL_TABLET | Freq: Four times a day (QID) | ORAL | Status: DC | PRN

## 2015-04-10 NOTE — ED Notes (Signed)
Chest pain x 3 days

## 2015-04-10 NOTE — Discharge Instructions (Signed)

## 2015-04-10 NOTE — ED Provider Notes (Signed)
CSN: [id number]     Arrival date & time 04/10/15  2229 History   First MD Initiated Contact with Patient 04/10/15 0935     Chief Complaint  Patient presents with  . Chest Pain     (Consider location/radiation/quality/duration/timing/severity/associated sxs/prior Treatment) Patient is a 34 y.o. female presenting with chest pain. The history is provided by the patient.  Chest Pain Pain location:  R chest and L chest Pain quality: pressure and sharp   Pain radiates to:  Does not radiate Pain radiates to the back: yes   Pain severity:  Moderate Onset quality:  Gradual Duration:  3 days Timing:  Intermittent Progression:  Waxing and waning Chronicity:  New Context: at rest   Relieved by:  Nothing Worsened by:  Certain positions Ineffective treatments:  None tried Associated symptoms: no fever and no lower extremity edema     Past Medical History  Diagnosis Date  . Urinary tract infection   . Abnormal Pap smear   . Chlamydia   . Gestational diabetes   . Trichomoniasis of vagina 2015   Past Surgical History  Procedure Laterality Date  . Induced abortion    . Wisdom tooth extraction     Family History  Problem Relation Age of Onset  . Anesthesia problems Neg Hx   . Diabetes Father   . Hypertension Father   . Diabetes Paternal Aunt   . Hypertension Paternal Aunt   . Diabetes Paternal Grandmother   . Hypertension Paternal Grandmother   . Diabetes Paternal Grandfather   . Hypertension Paternal Grandfather    Social History  Substance Use Topics  . Smoking status: Current Some Day Smoker  . Smokeless tobacco: Never Used  . Alcohol Use: Yes     Comment: socially   OB History    Gravida Para Term Preterm AB TAB SAB Ectopic Multiple Living   3 2 2  0 1 1 0 0 0 2     Review of Systems  Constitutional: Negative for fever.  Cardiovascular: Positive for chest pain.  All other systems reviewed and are negative.     Allergies  Amoxicillin  Home Medications    Prior to Admission medications   Medication Sig Start Date End Date Taking? Authorizing Provider  acetaminophen (TYLENOL) 500 MG tablet Take 1,000 mg by mouth daily as needed for headache (for cramping.).    Historical Provider, MD   BP 120/75 mmHg  Pulse 72  Temp(Src) 98.5 F (36.9 C) (Oral)  Resp 18  Ht 4\' 11"  (1.499 m)  Wt 145 lb (65.772 kg)  BMI 29.27 kg/m2  SpO2 100% Physical Exam  Constitutional: She is oriented to person, place, and time. She appears well-developed and well-nourished. No distress.  HENT:  Head: Normocephalic.  Eyes: Conjunctivae are normal.  Neck: Neck supple. No tracheal deviation present.  Cardiovascular: Normal rate, regular rhythm and normal heart sounds.   Pulmonary/Chest: Effort normal and breath sounds normal. No respiratory distress.  Abdominal: Soft. She exhibits no distension. There is no tenderness.  Neurological: She is alert and oriented to person, place, and time.  Skin: Skin is warm and dry.  Psychiatric: She has a normal mood and affect.    ED Course  Procedures (including critical care time) Emergency Focused Ultrasound Exam Limited Ultrasound of the Heart and Pericardium  Performed and interpreted by Dr. Laneta Simmers Indication: positional chest pain Multiple views of the heart, pericardium, and IVC are obtained with a multi frequency probe.  Findings: nml contractility, no anechoic fluid, variable  IVC collapse Interpretation: nml ejection fraction, no pericardial effusion, no depressed CVP Images archived electronically.  CPT Code: 14431   Labs Review Labs Reviewed  CBC WITH DIFFERENTIAL/PLATELET - Abnormal; Notable for the following:    Hemoglobin 8.6 (*)    HCT 30.2 (*)    MCV 69.7 (*)    MCH 19.9 (*)    MCHC 28.5 (*)    RDW 17.3 (*)    Platelets 433 (*)    All other components within normal limits  BASIC METABOLIC PANEL - Abnormal; Notable for the following:    Sodium 134 (*)    All other components within normal limits   TROPONIN I  SEDIMENTATION RATE    Imaging Review Dg Chest 2 View  04/10/2015   CLINICAL DATA:  Three days of bilateral chest pain with increased symptoms on the right when patient is supine, current smoker.  EXAM: CHEST  2 VIEW  COMPARISON:  None in PACs  FINDINGS: The lungs are well-expanded and clear. The heart and mediastinal structures are normal. There is no pleural effusion or pneumothorax. The bony thorax is unremarkable.  IMPRESSION: There is no active cardiopulmonary disease.   Electronically Signed   By: David  Martinique M.D.   On: 04/10/2015 11:39   I have personally reviewed and evaluated these images and lab results as part of my medical decision-making.   EKG Interpretation   Date/Time:  Thursday April 10 2015 09:43:23 EDT Ventricular Rate:  76 PR Interval:  128 QRS Duration: 80 QT Interval:  376 QTC Calculation: 423 R Axis:   75 Text Interpretation:  Normal sinus rhythm with sinus arrhythmia Normal ECG  Confirmed by Jae Bruck MD, Keifer Habib ([id number]) on 04/10/2015 9:43:27 AM      MDM   Final diagnoses:  Chest wall pain    34 year old feel presents with intermittent chest pain over the last 3 days that is positional, worse with lying down, worse on her right side, with some associated shortness of breath and anxiety that occurs mostly at night. EKG is unremarkable, screening troponin is negative, symptoms are very atypical for ACS and at this time I do not suspect myocardial ischemia.  Positional symptoms are concerning for possible pericarditis, there is no elevation in the patient's sedimentation rate suggestive of this diagnosis, no typical EKG findings, no effusion noted on bedside ultrasound and chest x-ray is unremarkable. PERC negative. Symptoms do not appear to be GI in nature. Recommended trial of NSAIDs and close PCP follow-up for ongoing evaluation.    Leo Grosser, MD 04/10/15 [phone]

## 2015-04-10 NOTE — ED Notes (Signed)
Immediately started crying when EDP started talking with her

## 2015-08-03 NOTE — L&D Delivery Note (Signed)
Delivery Note At 12:55 PM a viable female was delivered via Vaginal, Spontaneous Delivery (Presentation: Occiput Anterior ;  ).  Nuchal Cord x1 reduced. APGAR: 8, 9; weight pending  .   Placenta status:intact 3 vessel Cord , .   with the following complications: None.  Cord pH: NA  Anesthesia:   Episiotomy: None Lacerations: 1st degree;Vaginal Suture Repair: 3.0 vicryl Est. Blood Loss (mL): 150  Mom to postpartum.  Baby to Couplet care / Skin to Skin.  Dhanvin Szeto J. 04/20/2016, 1:52 PM

## 2015-09-08 ENCOUNTER — Encounter (HOSPITAL_COMMUNITY): Payer: Self-pay | Admitting: Student

## 2015-09-08 ENCOUNTER — Inpatient Hospital Stay (HOSPITAL_COMMUNITY)
Admission: AD | Admit: 2015-09-08 | Discharge: 2015-09-08 | Disposition: A | Discharge status: Home / Self Care | Payer: BC Managed Care – PPO | Source: Ambulatory Visit | Attending: Obstetrics and Gynecology | Admitting: Obstetrics and Gynecology

## 2015-09-08 ENCOUNTER — Inpatient Hospital Stay (HOSPITAL_COMMUNITY): Payer: BC Managed Care – PPO

## 2015-09-08 DIAGNOSIS — O99011 Anemia complicating pregnancy, first trimester: Secondary | ICD-10-CM | POA: Diagnosis not present

## 2015-09-08 DIAGNOSIS — O3411 Maternal care for benign tumor of corpus uteri, first trimester: Secondary | ICD-10-CM | POA: Diagnosis not present

## 2015-09-08 DIAGNOSIS — D649 Anemia, unspecified: Secondary | ICD-10-CM | POA: Diagnosis not present

## 2015-09-08 DIAGNOSIS — Z3A09 9 weeks gestation of pregnancy: Secondary | ICD-10-CM | POA: Insufficient documentation

## 2015-09-08 DIAGNOSIS — O26891 Other specified pregnancy related conditions, first trimester: Secondary | ICD-10-CM | POA: Insufficient documentation

## 2015-09-08 DIAGNOSIS — Z87891 Personal history of nicotine dependence: Secondary | ICD-10-CM | POA: Diagnosis not present

## 2015-09-08 DIAGNOSIS — O9989 Other specified diseases and conditions complicating pregnancy, childbirth and the puerperium: Secondary | ICD-10-CM

## 2015-09-08 DIAGNOSIS — R109 Unspecified abdominal pain: Secondary | ICD-10-CM | POA: Diagnosis present

## 2015-09-08 DIAGNOSIS — Z3491 Encounter for supervision of normal pregnancy, unspecified, first trimester: Secondary | ICD-10-CM

## 2015-09-08 DIAGNOSIS — O3410 Maternal care for benign tumor of corpus uteri, unspecified trimester: Secondary | ICD-10-CM

## 2015-09-08 DIAGNOSIS — D259 Leiomyoma of uterus, unspecified: Secondary | ICD-10-CM

## 2015-09-08 DIAGNOSIS — O26899 Other specified pregnancy related conditions, unspecified trimester: Secondary | ICD-10-CM

## 2015-09-08 LAB — URINALYSIS, ROUTINE W REFLEX MICROSCOPIC
BILIRUBIN URINE: NEGATIVE
Glucose, UA: NEGATIVE mg/dL
Hgb urine dipstick: NEGATIVE
KETONES UR: NEGATIVE mg/dL
LEUKOCYTES UA: NEGATIVE
NITRITE: NEGATIVE
PROTEIN: NEGATIVE mg/dL
Specific Gravity, Urine: 1.015 (ref 1.005–1.030)
pH: 6.5 (ref 5.0–8.0)

## 2015-09-08 LAB — WET PREP, GENITAL
Clue Cells Wet Prep HPF POC: NONE SEEN
SPERM: NONE SEEN
TRICH WET PREP: NONE SEEN
Yeast Wet Prep HPF POC: NONE SEEN

## 2015-09-08 LAB — CBC
HCT: 27.8 % — ABNORMAL LOW (ref 36.0–46.0)
Hemoglobin: 7.9 g/dL — ABNORMAL LOW (ref 12.0–15.0)
MCH: 19.4 pg — ABNORMAL LOW (ref 26.0–34.0)
MCHC: 28.4 g/dL — ABNORMAL LOW (ref 30.0–36.0)
MCV: 68.1 fL — AB (ref 78.0–100.0)
PLATELETS: 433 10*3/uL — AB (ref 150–400)
RBC: 4.08 MIL/uL (ref 3.87–5.11)
RDW: 19.7 % — AB (ref 11.5–15.5)
WBC: 7.7 10*3/uL (ref 4.0–10.5)

## 2015-09-08 LAB — POCT PREGNANCY, URINE: Preg Test, Ur: POSITIVE — AB

## 2015-09-08 LAB — ABO/RH: ABO/RH(D): B POS

## 2015-09-08 LAB — HCG, QUANTITATIVE, PREGNANCY: hCG, Beta Chain, Quant, S: 72206 m[IU]/mL — ABNORMAL HIGH (ref <5)

## 2015-09-08 MED ORDER — METOCLOPRAMIDE HCL 10 MG PO TABS: 10.0000 mg | ORAL_TABLET | Freq: Four times a day (QID) | ORAL | Status: DC

## 2015-09-08 NOTE — Discharge Instructions (Signed)
Take iron supplement (ferrous sulfate 325/65) twice a day every day.     First Trimester of Pregnancy The first trimester of pregnancy is from week 1 until the end of week 12 (months 1 through 3). During this time, your baby will begin to develop inside you. At 6-8 weeks, the eyes and face are formed, and the heartbeat can be seen on ultrasound. At the end of 12 weeks, all the baby's organs are formed. Prenatal care is all the medical care you receive before the birth of your baby. Make sure you get good prenatal care and follow all of your doctor's instructions. HOME CARE  Medicines  Take medicine only as told by your doctor. Some medicines are safe and some are not during pregnancy.  Take your prenatal vitamins as told by your doctor.  Take medicine that helps you poop (stool softener) as needed if your doctor says it is okay. Diet  Eat regular, healthy meals.  Your doctor will tell you the amount of weight gain that is right for you.  Avoid raw meat and uncooked cheese.  If you feel sick to your stomach (nauseous) or throw up (vomit):  Eat 4 or 5 small meals a day instead of 3 large meals.  Try eating a few soda crackers.  Drink liquids between meals instead of during meals.  If you have a hard time pooping (constipation):  Eat high-fiber foods like fresh vegetables, fruit, and whole grains.  Drink enough fluids to keep your pee (urine) clear or pale yellow. Activity and Exercise  Exercise only as told by your doctor. Stop exercising if you have cramps or pain in your lower belly (abdomen) or low back.  Try to avoid standing for long periods of time. Move your legs often if you must stand in one place for a long time.  Avoid heavy lifting.  Wear low-heeled shoes. Sit and stand up straight.  You can have sex unless your doctor tells you not to. Relief of Pain or Discomfort  Wear a good support bra if your breasts are sore.  Take warm water baths (sitz baths) to  soothe pain or discomfort caused by hemorrhoids. Use hemorrhoid cream if your doctor says it is okay.  Rest with your legs raised if you have leg cramps or low back pain.  Wear support hose if you have puffy, bulging veins (varicose veins) in your legs. Raise (elevate) your feet for 15 minutes, 3-4 times a day. Limit salt in your diet. Prenatal Care  Schedule your prenatal visits by the twelfth week of pregnancy.  Write down your questions. Take them to your prenatal visits.  Keep all your prenatal visits as told by your doctor. Safety  Wear your seat belt at all times when driving.  Make a list of emergency phone numbers. The list should include numbers for family, friends, the hospital, and police and fire departments. General Tips  Ask your doctor for a referral to a local prenatal class. Begin classes no later than at the start of month 6 of your pregnancy.  Ask for help if you need counseling or help with nutrition. Your doctor can give you advice or tell you where to go for help.  Do not use hot tubs, steam rooms, or saunas.  Do not douche or use tampons or scented sanitary pads.  Do not cross your legs for long periods of time.  Avoid litter boxes and soil used by cats.  Avoid all smoking, herbs, and alcohol. Avoid  drugs not approved by your doctor.  Do not use any tobacco products, including cigarettes, chewing tobacco, and electronic cigarettes. If you need help quitting, ask your doctor. You may get counseling or other support to help you quit.  Visit your dentist. At home, brush your teeth with a soft toothbrush. Be gentle when you floss. GET HELP IF:  You are dizzy.  You have mild cramps or pressure in your lower belly.  You have a nagging pain in your belly area.  You continue to feel sick to your stomach, throw up, or have watery poop (diarrhea).  You have a bad smelling fluid coming from your vagina.  You have pain with peeing (urination).  You have  increased puffiness (swelling) in your face, hands, legs, or ankles. GET HELP RIGHT AWAY IF:   You have a fever.  You are leaking fluid from your vagina.  You have spotting or bleeding from your vagina.  You have very bad belly cramping or pain.  You gain or lose weight rapidly.  You throw up blood. It may look like coffee grounds.  You are around people who have [place] measles, fifth disease, or chickenpox.  You have a very bad headache.  You have shortness of breath.  You have any kind of trauma, such as from a fall or a car accident.   This information is not intended to replace advice given to you by your health care provider. Make sure you discuss any questions you have with your health care provider.   Document Released: 01/05/2008 Document Revised: 08/09/2014 Document Reviewed: 05/29/2013 Elsevier Interactive Patient Education 2016 [employer] Uterine Fibroids Uterine fibroids are tissue masses (tumors) that can develop in the womb (uterus). They are also called leiomyomas. This type of tumor is not cancerous (benign) and does not spread to other parts of the body outside of the pelvic area, which is between the hip bones. Occasionally, fibroids may develop in the fallopian tubes, in the cervix, or on the support structures (ligaments) that surround the uterus. You can have one or many fibroids. Fibroids can vary in size, weight, and where they grow in the uterus. Some can become quite large. Most fibroids do not require medical treatment. CAUSES A fibroid can develop when a single uterine cell keeps growing (replicating). Most cells in the human body have a control mechanism that keeps them from replicating without control. SIGNS AND SYMPTOMS Symptoms may include:   Heavy bleeding during your period.  Bleeding or spotting between periods.  Pelvic pain and pressure.  Bladder problems, such as needing to urinate more often (urinary frequency) or  urgently.  Inability to reproduce offspring (infertility).  Miscarriages. DIAGNOSIS Uterine fibroids are diagnosed through a physical exam. Your health care provider may feel the lumpy tumors during a pelvic exam. Ultrasonography and an MRI may be done to determine the size, location, and number of fibroids. TREATMENT Treatment may include:  Watchful waiting. This involves getting the fibroid checked by your health care provider to see if it grows or shrinks. Follow your health care provider's recommendations for how often to have this checked.  Hormone medicines. These can be taken by mouth or given through an intrauterine device (IUD).  Surgery.  Removing the fibroids (myomectomy) or the uterus (hysterectomy).  Removing blood supply to the fibroids (uterine artery embolization). If fibroids interfere with your fertility and you want to become pregnant, your health care provider may recommend having the fibroids removed.  HOME CARE INSTRUCTIONS  Keep all  follow-up visits as directed by your health care provider. This is important.  Take medicines only as directed by your health care provider.  If you were prescribed a hormone treatment, take the hormone medicines exactly as directed.  Do not take aspirin, because it can cause bleeding.  Ask your health care provider about taking iron pills and increasing the amount of dark green, leafy vegetables in your diet. These actions can help to boost your blood iron levels, which may be affected by heavy menstrual bleeding.  Pay close attention to your period and tell your health care provider about any changes, such as:  Increased blood flow that requires you to use more pads or tampons than usual per month.  A change in the number of days that your period lasts per month.  A change in symptoms that are associated with your period, such as abdominal cramping or back pain. SEEK MEDICAL CARE IF:  You have pelvic pain, back pain, or  abdominal cramps that cannot be controlled with medicines.  You have an increase in bleeding between and during periods.  You soak tampons or pads in a half hour or less.  You feel lightheaded, extra tired, or weak. SEEK IMMEDIATE MEDICAL CARE IF:  You faint.  You have a sudden increase in pelvic pain.   This information is not intended to replace advice given to you by your health care provider. Make sure you discuss any questions you have with your health care provider.   Document Released: 07/16/2000 Document Revised: 08/09/2014 Document Reviewed: 01/15/2014 Elsevier Interactive Patient Education 2016 [employer]. Anemia, Nonspecific Anemia is a condition in which the concentration of red blood cells or hemoglobin in the blood is below normal. Hemoglobin is a substance in red blood cells that carries oxygen to the tissues of the body. Anemia results in not enough oxygen reaching these tissues.  CAUSES  Common causes of anemia include:   Excessive bleeding. Bleeding may be internal or external. This includes excessive bleeding from periods (in women) or from the intestine.   Poor nutrition.   Chronic kidney, thyroid, and liver disease.  Bone marrow disorders that decrease red blood cell production.  Cancer and treatments for cancer.  HIV, AIDS, and their treatments.  Spleen problems that increase red blood cell destruction.  Blood disorders.  Excess destruction of red blood cells due to infection, medicines, and autoimmune disorders. SIGNS AND SYMPTOMS   Minor weakness.   Dizziness.   Headache.  Palpitations.   Shortness of breath, especially with exercise.   Paleness.  Cold sensitivity.  Indigestion.  Nausea.  Difficulty sleeping.  Difficulty concentrating. Symptoms may occur suddenly or they may develop slowly.  DIAGNOSIS  Additional blood tests are often needed. These help your health care provider determine the best treatment. Your health  care provider will check your stool for blood and look for other causes of blood loss.  TREATMENT  Treatment varies depending on the cause of the anemia. Treatment can include:   Supplements of iron, vitamin Y85, or folic acid.   Hormone medicines.   A blood transfusion. This may be needed if blood loss is severe.   Hospitalization. This may be needed if there is significant continual blood loss.   Dietary changes.  Spleen removal. HOME CARE INSTRUCTIONS Keep all follow-up appointments. It often takes many weeks to correct anemia, and having your health care provider check on your condition and your response to treatment is very important. SEEK IMMEDIATE MEDICAL CARE IF:  You develop extreme weakness, shortness of breath, or chest pain.   You become dizzy or have trouble concentrating.  You develop heavy vaginal bleeding.   You develop a rash.   You have bloody or black, tarry stools.   You faint.   You vomit up blood.   You vomit repeatedly.   You have abdominal pain.  You have a fever or persistent symptoms for more than 2-3 days.   You have a fever and your symptoms suddenly get worse.   You are dehydrated.  MAKE SURE YOU:  Understand these instructions.  Will watch your condition.  Will get help right away if you are not doing well or get worse.   This information is not intended to replace advice given to you by your health care provider. Make sure you discuss any questions you have with your health care provider.   Document Released: 08/26/2004 Document Revised: 03/21/2013 Document Reviewed: 01/12/2013 Elsevier Interactive Patient Education 2016 [employer]. Iron-Rich Diet Iron is a mineral that helps your body to produce hemoglobin. Hemoglobin is a protein in your red blood cells that carries oxygen to your body's tissues. Eating too little iron may cause you to feel weak and tired, and it can increase your risk for infection. Eating  enough iron is necessary for your body's metabolism, muscle function, and nervous system. Iron is naturally found in many foods. It can also be added to foods or fortified in foods. There are two types of dietary iron:  Heme iron. Heme iron is absorbed by the body more easily than nonheme iron. Heme iron is found in meat, poultry, and fish.  Nonheme iron. Nonheme iron is found in dietary supplements, iron-fortified grains, beans, and vegetables. You may need to follow an iron-rich diet if:  You have been diagnosed with iron deficiency or iron-deficiency anemia.  You have a condition that prevents you from absorbing dietary iron, such as:  Infection in your intestines.  Celiac disease. This involves long-lasting (chronic) inflammation of your intestines.  You do not eat enough iron.  You eat a diet that is high in foods that impair iron absorption.  You have lost a lot of blood.  You have heavy bleeding during your menstrual cycle.  You are pregnant. WHAT IS MY PLAN? Your health care provider may help you to determine how much iron you need per day based on your condition. Generally, when a person consumes sufficient amounts of iron in the diet, the following iron needs are met:  Men.  84-87 years old: 11 mg per day.  50-27 years old: 8 mg per day.  Women.   56-80 years old: 15 mg per day.  62-17 years old: 18 mg per day.  Over 56 years old: 8 mg per day.  Pregnant women: 27 mg per day.  Breastfeeding women: 9 mg per day. WHAT DO I NEED TO KNOW ABOUT AN IRON-RICH DIET?  Eat fresh fruits and vegetables that are high in vitamin C along with foods that are high in iron. This will help increase the amount of iron that your body absorbs from food, especially with foods containing nonheme iron. Foods that are high in vitamin C include oranges, peppers, tomatoes, and mango.  Take iron supplements only as directed by your health care provider. Overdose of iron can be  life-threatening. If you were prescribed iron supplements, take them with orange juice or a vitamin C supplement.  Cook foods in pots and pans that are made from iron.  Eat nonheme iron-containing foods alongside foods that are high in heme iron. This helps to improve your iron absorption.   Certain foods and drinks contain compounds that impair iron absorption. Avoid eating these foods in the same meal as iron-rich foods or with iron supplements. These include:  Coffee, black tea, and red wine.  Milk, dairy products, and foods that are high in calcium.  Beans, soybeans, and peas.  Whole grains.  When eating foods that contain both nonheme iron and compounds that impair iron absorption, follow these tips to absorb iron better.   Soak beans overnight before cooking.  Soak whole grains overnight and drain them before using.  Ferment flours before baking, such as using yeast in bread dough. WHAT FOODS CAN I EAT? Grains Iron-fortified breakfast cereal. Iron-fortified whole-wheat bread. Enriched rice. Sprouted grains. Vegetables Spinach. Potatoes with skin. Green peas. Broccoli. Red and green bell peppers. Fermented vegetables. Fruits Prunes. Raisins. Oranges. Strawberries. Mango. Grapefruit. Meats and Other Protein Sources Beef liver. Oysters. Beef. Shrimp. [place]. Chicken. [place]. Sardines. Chickpeas. Nuts. Tofu. Beverages Tomato juice. Fresh orange juice. Prune juice. Hibiscus tea. Fortified instant breakfast shakes. Condiments Tahini. Fermented soy sauce. Sweets and Desserts Black-strap molasses.  Other Wheat germ. The items listed above may not be a complete list of recommended foods or beverages. Contact your dietitian for more options. WHAT FOODS ARE NOT RECOMMENDED? Grains Whole grains. Bran cereal. Bran flour. Oats. Vegetables Artichokes. Brussels sprouts. Kale. Fruits Blueberries. Raspberries. Strawberries. Figs. Meats and Other Protein  Sources Soybeans. Products made from soy protein. Dairy Milk. Cream. Cheese. Yogurt. Cottage cheese. Beverages Coffee. Black tea. Red wine. Sweets and Desserts Cocoa. Chocolate. Ice cream. Other Basil. Oregano. Parsley. The items listed above may not be a complete list of foods and beverages to avoid. Contact your dietitian for more information.   This information is not intended to replace advice given to you by your health care provider. Make sure you discuss any questions you have with your health care provider.   Document Released: 03/02/2005 Document Revised: 08/09/2014 Document Reviewed: 02/13/2014 Elsevier Interactive Patient Education [employer].

## 2015-09-08 NOTE — MAU Note (Signed)
C/o intermittent, sharp abdominal pain for past month that covers entire abdomen; c/o nausea for past month; denies any bleeding; denies vaginal discharge;

## 2015-09-08 NOTE — MAU Provider Note (Signed)
History     CSN: [id number]  Arrival date and time: 09/08/15 1006   First Provider Initiated Contact with Patient 09/08/15 1049         Chief Complaint  Patient presents with  . Abdominal Pain  . Possible Pregnancy  . Morning Sickness   HPI  Sherri Douglas is a 35 y.o. [id number] at [redacted]w[redacted]d by LMP who presents with abdominal pain & nausea.  Reports intermittent lower abdominal pain x 1 month. States pain is sporadic, at times occurring 2-3 times per day, but also goes some days without it. Pain lasts 3-5 minutes at a time & is described as sharp. Last felt pain this morning and states it lasted longer than normal.  Daily nausea x 1 month. Vomited this morning for the first time during the pregnancy.  Loose stools x 2 since yesterday.  Denies urinary complaints, vaginal bleeding, vaginal discharge, dyspareunia, or post coital bleeding.    OB History    Gravida Para Term Preterm AB TAB SAB Ectopic Multiple Living   4 2 2  0 1 1 0 0 0 2      Past Medical History  Diagnosis Date  . Urinary tract infection   . Abnormal Pap smear   . Chlamydia   . Gestational diabetes   . Trichomoniasis of vagina 2015    Past Surgical History  Procedure Laterality Date  . Induced abortion    . Wisdom tooth extraction      Family History  Problem Relation Age of Onset  . Anesthesia problems Neg Hx   . Diabetes Father   . Hypertension Father   . Diabetes Paternal Aunt   . Hypertension Paternal Aunt   . Diabetes Paternal Grandmother   . Hypertension Paternal Grandmother   . Diabetes Paternal Grandfather   . Hypertension Paternal Grandfather     Social History  Substance Use Topics  . Smoking status: Former Research scientist (life sciences)  . Smokeless tobacco: Never Used  . Alcohol Use: Yes     Comment: socially    Allergies:  Allergies  Allergen Reactions  . Amoxicillin Rash    Prescriptions prior to admission  Medication Sig Dispense Refill Last Dose  . acetaminophen (TYLENOL) 500 MG tablet Take 1,000  mg by mouth daily as needed for headache (for cramping.).   Past Week at Unknown time  . ibuprofen (ADVIL,MOTRIN) 600 MG tablet Take 1 tablet (600 mg total) by mouth every 6 (six) hours as needed. 30 tablet 0     Review of Systems  Constitutional: Negative.   Gastrointestinal: Positive for nausea, vomiting and abdominal pain. Negative for diarrhea and constipation.  Genitourinary: Negative.    Physical Exam   Blood pressure 113/73, pulse 78, temperature 98.2 F (36.8 C), resp. rate 18, last menstrual period 07/07/2015.  Physical Exam  Nursing note and vitals reviewed. Constitutional: She is oriented to person, place, and time. She appears well-developed and well-nourished. No distress.  HENT:  Head: Normocephalic and atraumatic.  Eyes: Conjunctivae are normal. Right eye exhibits no discharge. Left eye exhibits no discharge. No scleral icterus.  Neck: Normal range of motion.  Cardiovascular: Normal rate, regular rhythm and normal heart sounds.   No murmur heard. Respiratory: Effort normal and breath sounds normal. No respiratory distress. She has no wheezes.  GI: Soft. Bowel sounds are normal. She exhibits no distension. There is no tenderness.  Neurological: She is alert and oriented to person, place, and time.  Skin: Skin is warm and dry. She is not diaphoretic.  Psychiatric: She has a normal mood and affect. Her behavior is normal. Judgment and thought content normal.    MAU Course  Procedures Results for orders placed or performed during the hospital encounter of 09/08/15 (from the past 24 hour(s))  Urinalysis, Routine w reflex microscopic (not at [facility])     Status: None   Collection Time: 09/08/15 10:23 AM  Result Value Ref Range   Color, Urine YELLOW YELLOW   APPearance CLEAR CLEAR   Specific Gravity, Urine 1.015 1.005 - 1.030   pH 6.5 5.0 - 8.0   Glucose, UA NEGATIVE NEGATIVE mg/dL   Hgb urine dipstick NEGATIVE NEGATIVE   Bilirubin Urine NEGATIVE NEGATIVE   Ketones, ur  NEGATIVE NEGATIVE mg/dL   Protein, ur NEGATIVE NEGATIVE mg/dL   Nitrite NEGATIVE NEGATIVE   Leukocytes, UA NEGATIVE NEGATIVE  Pregnancy, urine POC     Status: Abnormal   Collection Time: 09/08/15 10:30 AM  Result Value Ref Range   Preg Test, Ur POSITIVE (A) NEGATIVE  ABO/Rh     Status: None   Collection Time: 09/08/15 11:14 AM  Result Value Ref Range   ABO/RH(D) B POS   hCG, quantitative, pregnancy     Status: Abnormal   Collection Time: 09/08/15 11:14 AM  Result Value Ref Range   hCG, Beta Chain, Quant, S 72206 (H) <5 mIU/mL  CBC     Status: Abnormal   Collection Time: 09/08/15 11:16 AM  Result Value Ref Range   WBC 7.7 4.0 - 10.5 K/uL   RBC 4.08 3.87 - 5.11 MIL/uL   Hemoglobin 7.9 (L) 12.0 - 15.0 g/dL   HCT 27.8 (L) 36.0 - 46.0 %   MCV 68.1 (L) 78.0 - 100.0 fL   MCH 19.4 (L) 26.0 - 34.0 pg   MCHC 28.4 (L) 30.0 - 36.0 g/dL   RDW 19.7 (H) 11.5 - 15.5 %   Platelets 433 (H) 150 - 400 K/uL  Wet prep, genital     Status: Abnormal   Collection Time: 09/08/15 12:12 PM  Result Value Ref Range   Yeast Wet Prep HPF POC NONE SEEN NONE SEEN   Trich, Wet Prep NONE SEEN NONE SEEN   Clue Cells Wet Prep HPF POC NONE SEEN NONE SEEN   WBC, Wet Prep HPF POC MODERATE (A) NONE SEEN   Sperm NONE SEEN    US Ob Comp Less 14 Wks  09/08/2015  CLINICAL DATA:  First trimester bleeding.  Nausea and vomiting. EXAM: OBSTETRIC <14 WK [place] AND TRANSVAGINAL OB US TECHNIQUE: Both transabdominal and transvaginal ultrasound examinations were performed for complete evaluation of the gestation as well as the maternal uterus, adnexal regions, and pelvic cul-de-sac. Transvaginal technique was performed to assess early pregnancy. COMPARISON:  None. FINDINGS: Intrauterine gestational sac: Visualized/normal in shape. Yolk sac:  Seen and normal in appearance. Embryo:  Seen and normal in appearance. Cardiac Activity: A fetal heart rate of 167 beats per minute is identified. Heart Rate: 167  bpm CRL: 1.88 cm mm 8 w 3 d [place] [facility]:  April 16, 2016 Subchorionic hemorrhage:  None visualized. Maternal uterus/adnexae: Multiple fibroids are seen in the uterus. The largest fibroid on the right measures 2.6 x 2.4 x 3.2 cm. The largest fibroid on the left measures 2 x 1.6 x 2.5 cm. The ovaries are normal in appearance. IMPRESSION: Live IUP.  No abnormalities. Electronically Signed   By: Dorise Bullion III M.D   On: 09/08/2015 12:07   US Ob Transvaginal  09/08/2015  CLINICAL DATA:  First  trimester bleeding.  Nausea and vomiting. EXAM: OBSTETRIC <14 WK [place] AND TRANSVAGINAL OB US TECHNIQUE: Both transabdominal and transvaginal ultrasound examinations were performed for complete evaluation of the gestation as well as the maternal uterus, adnexal regions, and pelvic cul-de-sac. Transvaginal technique was performed to assess early pregnancy. COMPARISON:  None. FINDINGS: Intrauterine gestational sac: Visualized/normal in shape. Yolk sac:  Seen and normal in appearance. Embryo:  Seen and normal in appearance. Cardiac Activity: A fetal heart rate of 167 beats per minute is identified. Heart Rate: 167  bpm CRL: 1.88 cm mm 8 w 3 d [place] [facility]: April 16, 2016 Subchorionic hemorrhage:  None visualized. Maternal uterus/adnexae: Multiple fibroids are seen in the uterus. The largest fibroid on the right measures 2.6 x 2.4 x 3.2 cm. The largest fibroid on the left measures 2 x 1.6 x 2.5 cm. The ovaries are normal in appearance. IMPRESSION: Live IUP.  No abnormalities. Electronically Signed   By: Dorise Bullion III M.D   On: 09/08/2015 12:07    MDM UPT positive B positive No symptoms at this time SIUP per ultrasound 2 small uterine fibroids Assessment and Plan  A: 1. Anemia affecting pregnancy in first trimester   2. Abdominal pain in pregnancy   3. Uterine fibroid during pregnancy, antepartum   4. Normal IUP (intrauterine pregnancy) on prenatal ultrasound, first trimester     P: Discharge home GC/CT pending Start taking iron supplements twice  daily & increase iron rich foods Start prenatal care Pregnancy verification letter & list of providers given Discussed reasons to return to [place], NP  09/08/2015, 10:48 AM

## 2015-09-09 LAB — GC/CHLAMYDIA PROBE AMP (~~LOC~~) NOT AT ARMC
CHLAMYDIA, DNA PROBE: NEGATIVE
NEISSERIA GONORRHEA: NEGATIVE

## 2015-09-09 LAB — HIV ANTIBODY (ROUTINE TESTING W REFLEX): HIV Screen 4th Generation wRfx: NONREACTIVE

## 2015-09-18 LAB — OB RESULTS CONSOLE RPR: RPR: NONREACTIVE

## 2015-09-18 LAB — OB RESULTS CONSOLE HIV ANTIBODY (ROUTINE TESTING): HIV: NONREACTIVE

## 2015-09-18 LAB — OB RESULTS CONSOLE ANTIBODY SCREEN: ANTIBODY SCREEN: NEGATIVE

## 2015-09-18 LAB — OB RESULTS CONSOLE ABO/RH: RH Type: POSITIVE

## 2015-09-18 LAB — OB RESULTS CONSOLE HEPATITIS B SURFACE ANTIGEN: HEP B S AG: NEGATIVE

## 2015-09-18 LAB — OB RESULTS CONSOLE RUBELLA ANTIBODY, IGM: RUBELLA: IMMUNE

## 2015-10-01 ENCOUNTER — Other Ambulatory Visit: Payer: Self-pay | Admitting: Obstetrics and Gynecology

## 2015-10-01 ENCOUNTER — Other Ambulatory Visit (HOSPITAL_COMMUNITY)
Admission: RE | Admit: 2015-10-01 | Discharge: 2015-10-01 | Disposition: A | Discharge status: Home / Self Care | Payer: BC Managed Care – PPO | Source: Ambulatory Visit | Attending: Obstetrics and Gynecology | Admitting: Obstetrics and Gynecology

## 2015-10-01 DIAGNOSIS — Z1151 Encounter for screening for human papillomavirus (HPV): Secondary | ICD-10-CM | POA: Diagnosis present

## 2015-10-01 DIAGNOSIS — Z113 Encounter for screening for infections with a predominantly sexual mode of transmission: Secondary | ICD-10-CM | POA: Insufficient documentation

## 2015-10-01 DIAGNOSIS — Z01419 Encounter for gynecological examination (general) (routine) without abnormal findings: Secondary | ICD-10-CM | POA: Diagnosis present

## 2015-10-02 LAB — CYTOLOGY - PAP

## 2016-02-10 ENCOUNTER — Ambulatory Visit (HOSPITAL_BASED_OUTPATIENT_CLINIC_OR_DEPARTMENT_OTHER): Payer: BC Managed Care – PPO | Admitting: Hematology and Oncology

## 2016-02-10 ENCOUNTER — Ambulatory Visit: Payer: BC Managed Care – PPO

## 2016-02-10 ENCOUNTER — Telehealth: Payer: Self-pay | Admitting: Hematology and Oncology

## 2016-02-10 ENCOUNTER — Encounter: Payer: Self-pay | Admitting: Hematology and Oncology

## 2016-02-10 VITALS — BP 120/68 | HR 99 | Temp 98.4°F | Resp 18 | Wt 158.1 lb

## 2016-02-10 DIAGNOSIS — O99013 Anemia complicating pregnancy, third trimester: Secondary | ICD-10-CM | POA: Diagnosis not present

## 2016-02-10 DIAGNOSIS — D509 Iron deficiency anemia, unspecified: Secondary | ICD-10-CM | POA: Insufficient documentation

## 2016-02-10 NOTE — Assessment & Plan Note (Signed)
She is profoundly anemic with severe iron deficiency. We discussed the risk and benefit of blood transfusion. The patient declined blood transfusion regardless of hemoglobin level

## 2016-02-10 NOTE — Progress Notes (Signed)
[place] NOTE  Patient Care Team: Heath Lark, MD as Consulting Physician (Hematology and Oncology) Christophe Louis, MD as Consulting Physician (Obstetrics and Gynecology)  CHIEF COMPLAINTS/PURPOSE OF CONSULTATION:  Severe iron deficiency anemia, third trimester pregnancy  HISTORY OF PRESENTING ILLNESS:  Sherri Douglas 35 y.o. female is here because of severe iron deficiency anemia in the context of pregnancy. The patient is currently pregnant with her third child. She is currently [redacted] weeks pregnant with expected due date around 04/12/2016. She has 2 older children; her son is 32 and her daughter is 9 years old. She denies diagnosis of anemia pregnancy with her other 2 children. The patient denies history of menorrhagia. She was found to have abnormal CBC from recent blood work monitoring. The patient was admitted briefly to the [facility] around February of this year with profound microcytic anemia with hemoglobin of 7.9. On 10/01/2015, hemoglobin was 8.3 with low MCV On 01/27/2016, hemoglobin is low at 7.7 with low MCV again. Her last iron studies on 10/01/2015 was low with ferritin of 4.7. She denies recent chest pain on exertion, shortness of breath on minimal exertion or palpitations. She had one recent history of presyncopal episode. She thought she might have fainted briefly. She denies pain or accident or seizure activity. It was preceded with sensation of lightheadedness She had not noticed any recent bleeding such as epistaxis, hematuria or hematochezia The patient denies over the counter NSAID ingestion. She is not on antiplatelets agents.  She had no prior history or diagnosis of cancer. Her age appropriate screening programs are up-to-date. She complained of pica with excessive chewing of ice and eats a variety of diet. She had donated blood twice many years ago but has never received blood transfusion The patient was prescribed oral iron supplements and  she takes sporadically. She cannot tolerate oral iron supplement due to severe nausea associated with it.  MEDICAL HISTORY:  Past Medical History  Diagnosis Date  . Urinary tract infection   . Abnormal Pap smear   . Chlamydia   . Gestational diabetes   . Trichomoniasis of vagina 2015    SURGICAL HISTORY: Past Surgical History  Procedure Laterality Date  . Induced abortion    . Wisdom tooth extraction      SOCIAL HISTORY: Social History   Social History  . Marital Status: Single    Spouse Name: N/A  . Number of Children: N/A  . Years of Education: N/A   Occupational History  . Not on file.   Social History Main Topics  . Smoking status: Former Research scientist (life sciences)  . Smokeless tobacco: Never Used  . Alcohol Use: Yes     Comment: socially  . Drug Use: No  . Sexual Activity: Yes    Birth Control/ Protection: None     Comment: academic advisor   Other Topics Concern  . Not on file   Social History Narrative    FAMILY HISTORY: Family History  Problem Relation Age of Onset  . Anesthesia problems Neg Hx   . Diabetes Father   . Hypertension Father   . Diabetes Paternal Aunt   . Hypertension Paternal Aunt   . Diabetes Paternal Grandmother   . Hypertension Paternal Grandmother   . Diabetes Paternal Grandfather   . Hypertension Paternal Grandfather     ALLERGIES:  is allergic to amoxicillin.  MEDICATIONS:  Current Outpatient Prescriptions  Medication Sig Dispense Refill  . Prenatal MV-Min-Fe Fum-FA-DHA (PRENATAL 1 PO) Take by mouth daily.  No current facility-administered medications for this visit.    REVIEW OF SYSTEMS:   Constitutional: Denies fevers, chills or abnormal night sweats Eyes: Denies blurriness of vision, double vision or watery eyes Ears, nose, mouth, throat, and face: Denies mucositis or sore throat Respiratory: Denies cough, dyspnea or wheezes Cardiovascular: Denies palpitation, chest discomfort or lower extremity swelling Gastrointestinal:   Denies nausea, heartburn or change in bowel habits Skin: Denies abnormal skin rashes Lymphatics: Denies new lymphadenopathy or easy bruising Neurological:Denies numbness, tingling or new weaknesses Behavioral/Psych: Mood is stable, no new changes  All other systems were reviewed with the patient and are negative.  PHYSICAL EXAMINATION: ECOG PERFORMANCE STATUS: 1 - Symptomatic but completely ambulatory  Filed Vitals:   02/10/16 1423  BP: 120/68  Pulse: 99  Temp: 98.4 F (36.9 C)  Resp: 18   Filed Weights   02/10/16 1423  Weight: 158 lb 1.6 oz (71.714 kg)    GENERAL:alert, no distress and comfortable SKIN: skin color, texture, turgor are normal, no rashes or significant lesions EYES: normal, conjunctiva are pale and non-injected, sclera clear OROPHARYNX:no exudate, no erythema and lips, buccal mucosa, and tongue normal  NECK: supple, thyroid normal size, non-tender, without nodularity LYMPH:  no palpable lymphadenopathy in the cervical, axillary or inguinal LUNGS: clear to auscultation and percussion with normal breathing effort HEART: regular rate & rhythm and no murmurs and no lower extremity edema ABDOMEN:abdomen soft, non-tender and normal bowel sounds. Limited examination due to pregnant uterus Musculoskeletal:no cyanosis of digits and no clubbing  PSYCH: alert & oriented x 3 with fluent speech NEURO: no focal motor/sensory deficits  ASSESSMENT & PLAN:  Iron deficiency anemia The most likely cause of her anemia is due to chronic blood loss/malabsorption syndrome. We discussed some of the risks, benefits, and alternatives of intravenous iron infusions. The patient is symptomatic from anemia and the iron level is critically low. She tolerated oral iron supplement poorly and desires to achieved higher levels of iron faster for adequate hematopoesis. Some of the side-effects to be expected including risks of infusion reactions, phlebitis, headaches, nausea and fatigue.  The  patient is willing to proceed. Patient education material was dispensed.  Goal is to keep ferritin level greater than 50 With her pregnancy, I will arrange monitoring before and after treatment After 2 doses of IV iron, I will recheck another blood work and ferritin within a month. I will call the patient with test results. If the ferritin level again is less than 50, she would need further treatment. After delivery, I would recommend repeat CBC and iron study begins with in 3 months. She needs to continue her prenatal vitamin   Anemia affecting pregnancy in third trimester, antepartum She is profoundly anemic with severe iron deficiency. We discussed the risk and benefit of blood transfusion. The patient declined blood transfusion regardless of hemoglobin level  Orders Placed This Encounter  Procedures  . CBC & Diff and Retic    Standing Status: Standing     Number of Occurrences: 3     Standing Expiration Date: 02/09/2017  . Ferritin    Standing Status: Standing     Number of Occurrences: 3     Standing Expiration Date: 02/09/2017     All questions were answered. The patient knows to call the clinic with any problems, questions or concerns. I spent 30 minutes counseling the patient face to face. The total time spent in the appointment was 40 minutes and more than 50% was on counseling.  [facility], [place], MD 02/10/2016 4:22 PM

## 2016-02-10 NOTE — Telephone Encounter (Signed)
per pof to sch pt appt-gave pt copy of avs °

## 2016-02-10 NOTE — Assessment & Plan Note (Signed)
The most likely cause of her anemia is due to chronic blood loss/malabsorption syndrome. We discussed some of the risks, benefits, and alternatives of intravenous iron infusions. The patient is symptomatic from anemia and the iron level is critically low. She tolerated oral iron supplement poorly and desires to achieved higher levels of iron faster for adequate hematopoesis. Some of the side-effects to be expected including risks of infusion reactions, phlebitis, headaches, nausea and fatigue.  The patient is willing to proceed. Patient education material was dispensed.  Goal is to keep ferritin level greater than 50 With her pregnancy, I will arrange monitoring before and after treatment After 2 doses of IV iron, I will recheck another blood work and ferritin within a month. I will call the patient with test results. If the ferritin level again is less than 50, she would need further treatment. After delivery, I would recommend repeat CBC and iron study begins with in 3 months. She needs to continue her prenatal vitamin

## 2016-02-11 ENCOUNTER — Telehealth: Payer: Self-pay | Admitting: *Deleted

## 2016-02-11 NOTE — Telephone Encounter (Signed)
Pt did not go to lab after her appt w/ Dr. Alvy Bimler yesterday.  Called pt and she says she was not aware she was supposed to go.   Informed pt she will need to come in for lab prior to her Iron infusion on 7/21.   Pt says she can come in Monday around noon for lab.   POF sent for lab only appt.. Informed pt I will request noon and Scheduler will call her w/ exact time.  She verbalized understanding.

## 2016-02-16 ENCOUNTER — Other Ambulatory Visit (HOSPITAL_BASED_OUTPATIENT_CLINIC_OR_DEPARTMENT_OTHER): Payer: BC Managed Care – PPO

## 2016-02-16 DIAGNOSIS — D509 Iron deficiency anemia, unspecified: Secondary | ICD-10-CM

## 2016-02-16 LAB — CBC & DIFF AND RETIC
BASO%: 0.4 % (ref 0.0–2.0)
BASOS ABS: 0 10*3/uL (ref 0.0–0.1)
EOS%: 0.6 % (ref 0.0–7.0)
Eosinophils Absolute: 0 10*3/uL (ref 0.0–0.5)
HEMATOCRIT: 25.6 % — AB (ref 34.8–46.6)
HGB: 7.6 g/dL — ABNORMAL LOW (ref 11.6–15.9)
Immature Retic Fract: 22.3 % — ABNORMAL HIGH (ref 1.60–10.00)
LYMPH%: 27 % (ref 14.0–49.7)
MCH: 19.8 pg — AB (ref 25.1–34.0)
MCHC: 29.8 g/dL — AB (ref 31.5–36.0)
MCV: 66.6 fL — AB (ref 79.5–101.0)
MONO#: 0.5 10*3/uL (ref 0.1–0.9)
MONO%: 5.3 % (ref 0.0–14.0)
NEUT#: 5.9 10*3/uL (ref 1.5–6.5)
NEUT%: 66.7 % (ref 38.4–76.8)
PLATELETS: 324 10*3/uL (ref 145–400)
RBC: 3.85 10*6/uL (ref 3.70–5.45)
RDW: 19 % — ABNORMAL HIGH (ref 11.2–14.5)
Retic %: 1.77 % (ref 0.70–2.10)
Retic Ct Abs: 68.15 10*3/uL (ref 33.70–90.70)
WBC: 8.8 10*3/uL (ref 3.9–10.3)
lymph#: 2.4 10*3/uL (ref 0.9–3.3)

## 2016-02-16 LAB — FERRITIN: Ferritin: 7 ng/ml — ABNORMAL LOW (ref 9–269)

## 2016-02-20 ENCOUNTER — Ambulatory Visit (HOSPITAL_BASED_OUTPATIENT_CLINIC_OR_DEPARTMENT_OTHER): Payer: BC Managed Care – PPO

## 2016-02-20 VITALS — BP 112/68 | HR 86 | Temp 99.0°F | Resp 18

## 2016-02-20 DIAGNOSIS — D509 Iron deficiency anemia, unspecified: Secondary | ICD-10-CM | POA: Diagnosis not present

## 2016-02-20 MED ORDER — SODIUM CHLORIDE 0.9 % IV SOLN
Freq: Once | INTRAVENOUS | Status: AC
  Administered 2016-02-20: 09:00:00 via INTRAVENOUS

## 2016-02-20 MED ORDER — SODIUM CHLORIDE 0.9 % IV SOLN
510.0000 mg | Freq: Once | INTRAVENOUS | Status: AC
  Administered 2016-02-20: 510 mg via INTRAVENOUS
  Filled 2016-02-20: qty 17

## 2016-02-20 NOTE — Progress Notes (Signed)
Fetal movement palpated.

## 2016-02-20 NOTE — Progress Notes (Signed)
Cold juice given.

## 2016-02-20 NOTE — Progress Notes (Addendum)
0900  Call placed to Dr. Nelda Marseille of [facility] to obtain order for NST pre and post iron infusion. 1005 Call placed to Dr. Nelda Marseille.  Report on NST's given.  FHR reassuring with 1 15X15 and a few 10X10 accels.  Moderate variability maintained. Fetal movement palpated. Dr. Nelda Marseille clears patient for discharge with follow up in the office next week.

## 2016-02-20 NOTE — Patient Instructions (Signed)

## 2016-02-27 ENCOUNTER — Inpatient Hospital Stay (HOSPITAL_COMMUNITY): Payer: BC Managed Care – PPO

## 2016-02-27 ENCOUNTER — Encounter (HOSPITAL_COMMUNITY): Payer: Self-pay | Admitting: *Deleted

## 2016-02-27 ENCOUNTER — Ambulatory Visit (HOSPITAL_BASED_OUTPATIENT_CLINIC_OR_DEPARTMENT_OTHER): Payer: BC Managed Care – PPO

## 2016-02-27 ENCOUNTER — Inpatient Hospital Stay (HOSPITAL_COMMUNITY)
Admission: AD | Admit: 2016-02-27 | Discharge: 2016-02-27 | Disposition: A | Discharge status: Home / Self Care | Payer: BC Managed Care – PPO | Source: Ambulatory Visit | Attending: Obstetrics and Gynecology | Admitting: Obstetrics and Gynecology

## 2016-02-27 ENCOUNTER — Other Ambulatory Visit: Payer: Self-pay | Admitting: Obstetrics and Gynecology

## 2016-02-27 VITALS — BP 119/76 | HR 79 | Temp 98.5°F | Resp 18

## 2016-02-27 DIAGNOSIS — Z880 Allergy status to penicillin: Secondary | ICD-10-CM | POA: Diagnosis not present

## 2016-02-27 DIAGNOSIS — D509 Iron deficiency anemia, unspecified: Secondary | ICD-10-CM

## 2016-02-27 DIAGNOSIS — Z3A33 33 weeks gestation of pregnancy: Secondary | ICD-10-CM

## 2016-02-27 DIAGNOSIS — O99013 Anemia complicating pregnancy, third trimester: Secondary | ICD-10-CM

## 2016-02-27 DIAGNOSIS — O289 Unspecified abnormal findings on antenatal screening of mother: Secondary | ICD-10-CM | POA: Diagnosis not present

## 2016-02-27 DIAGNOSIS — Z87891 Personal history of nicotine dependence: Secondary | ICD-10-CM | POA: Insufficient documentation

## 2016-02-27 DIAGNOSIS — O09523 Supervision of elderly multigravida, third trimester: Secondary | ICD-10-CM | POA: Diagnosis not present

## 2016-02-27 DIAGNOSIS — Z36 Encounter for antenatal screening of mother: Secondary | ICD-10-CM | POA: Diagnosis not present

## 2016-02-27 DIAGNOSIS — Z3689 Encounter for other specified antenatal screening: Secondary | ICD-10-CM

## 2016-02-27 DIAGNOSIS — IMO0002 Reserved for concepts with insufficient information to code with codable children: Secondary | ICD-10-CM

## 2016-02-27 HISTORY — DX: Anemia, unspecified: D64.9

## 2016-02-27 MED ORDER — SODIUM CHLORIDE 0.9 % IV SOLN
Freq: Once | INTRAVENOUS | Status: AC
  Administered 2016-02-27: 09:00:00 via INTRAVENOUS

## 2016-02-27 MED ORDER — SODIUM CHLORIDE 0.9 % IV SOLN
510.0000 mg | Freq: Once | INTRAVENOUS | Status: AC
  Administered 2016-02-27: 510 mg via INTRAVENOUS
  Filled 2016-02-27: qty 17

## 2016-02-27 NOTE — Patient Instructions (Signed)

## 2016-02-27 NOTE — Discharge Instructions (Signed)
Braxton Hicks Contractions °Contractions of the uterus can occur throughout pregnancy. Contractions are not always a sign that you are in labor.  °WHAT ARE BRAXTON HICKS CONTRACTIONS?  °Contractions that occur before labor are called Braxton Hicks contractions, or false labor. Toward the end of pregnancy (32-34 weeks), these contractions can develop more often and may become more forceful. This is not true labor because these contractions do not result in opening (dilatation) and thinning of the cervix. They are sometimes difficult to tell apart from true labor because these contractions can be forceful and people have different pain tolerances. You should not feel embarrassed if you go to the hospital with false labor. Sometimes, the only way to tell if you are in true labor is for your health care provider to look for changes in the cervix. °If there are no prenatal problems or other health problems associated with the pregnancy, it is completely safe to be sent home with false labor and await the onset of true labor. °HOW CAN YOU TELL THE DIFFERENCE BETWEEN TRUE AND FALSE LABOR? °False Labor °· The contractions of false labor are usually shorter and not as hard as those of true labor.   °· The contractions are usually irregular.   °· The contractions are often felt in the front of the lower abdomen and in the groin.   °· The contractions may go away when you walk around or change positions while lying down.   °· The contractions get weaker and are shorter lasting as time goes on.   °· The contractions do not usually become progressively stronger, regular, and closer together as with true labor.   °True Labor °· Contractions in true labor last 30-70 seconds, become very regular, usually become more intense, and increase in frequency.   °· The contractions do not go away with walking.   °· The discomfort is usually felt in the top of the uterus and spreads to the lower abdomen and low back.   °· True labor can be  determined by your health care provider with an exam. This will show that the cervix is dilating and getting thinner.   °WHAT TO REMEMBER °· Keep up with your usual exercises and follow other instructions given by your health care provider.   °· Take medicines as directed by your health care provider.   °· Keep your regular prenatal appointments.   °· Eat and drink lightly if you think you are going into labor.   °· If Braxton Hicks contractions are making you uncomfortable:   °¨ Change your position from lying down or resting to walking, or from walking to resting.   °¨ Sit and rest in a tub of warm water.   °¨ Drink 2-3 glasses of water. Dehydration may cause these contractions.   °¨ Do slow and deep breathing several times an hour.   °WHEN SHOULD I SEEK IMMEDIATE MEDICAL CARE? °Seek immediate medical care if: °· Your contractions become stronger, more regular, and closer together.   °· You have fluid leaking or gushing from your vagina.   °· You have a fever.   °· You pass blood-tinged mucus.   °· You have vaginal bleeding.   °· You have continuous abdominal pain.   °· You have low back pain that you never had before.   °· You feel your baby's head pushing down and causing pelvic pressure.   °· Your baby is not moving as much as it used to.   °  °This information is not intended to replace advice given to you by your health care provider. Make sure you discuss any questions you have with your health care   provider.   Document Released: 07/19/2005 Document Revised: 07/24/2013 Document Reviewed: 04/30/2013 Elsevier Interactive Patient Education 2016 [place]. Fetal Movement Counts Patient Name: __________________________________________________ Patient Due Date: ____________________ Performing a fetal movement count is highly recommended in high-risk pregnancies, but it is good for every pregnant woman to do. Your health care provider may ask you to start counting fetal movements at 28 weeks of the  pregnancy. Fetal movements often increase:  After eating a full meal.  After physical activity.  After eating or drinking something sweet or cold.  At rest. Pay attention to when you feel the baby is most active. This will help you notice a pattern of your baby's sleep and wake cycles and what factors contribute to an increase in fetal movement. It is important to perform a fetal movement count at the same time each day when your baby is normally most active.  HOW TO COUNT FETAL MOVEMENTS 1. Find a quiet and comfortable area to sit or lie down on your left side. Lying on your left side provides the best blood and oxygen circulation to your baby. 2. Write down the day and time on a sheet of paper or in a journal. 3. Start counting kicks, flutters, swishes, rolls, or jabs in a 2-hour period. You should feel at least 10 movements within 2 hours. 4. If you do not feel 10 movements in 2 hours, wait 2-3 hours and count again. Look for a change in the pattern or not enough counts in 2 hours. SEEK MEDICAL CARE IF:  You feel less than 10 counts in 2 hours, tried twice.  There is no movement in over an hour.  The pattern is changing or taking longer each day to reach 10 counts in 2 hours.  You feel the baby is not moving as he or she usually does. Date: ____________ Movements: ____________ Start time: ____________ Sherri Douglas time: ____________  Date: ____________ Movements: ____________ Start time: ____________ Sherri Douglas time: ____________ Date: ____________ Movements: ____________ Start time: ____________ Sherri Douglas time: ____________ Date: ____________ Movements: ____________ Start time: ____________ Sherri Douglas time: ____________ Date: ____________ Movements: ____________ Start time: ____________ Sherri Douglas time: ____________ Date: ____________ Movements: ____________ Start time: ____________ Sherri Douglas time: ____________ Date: ____________ Movements: ____________ Start time: ____________ Sherri Douglas time:  ____________ Date: ____________ Movements: ____________ Start time: ____________ Sherri Douglas time: ____________  Date: ____________ Movements: ____________ Start time: ____________ Sherri Douglas time: ____________ Date: ____________ Movements: ____________ Start time: ____________ Sherri Douglas time: ____________ Date: ____________ Movements: ____________ Start time: ____________ Sherri Douglas time: ____________ Date: ____________ Movements: ____________ Start time: ____________ Sherri Douglas time: ____________ Date: ____________ Movements: ____________ Start time: ____________ Sherri Douglas time: ____________ Date: ____________ Movements: ____________ Start time: ____________ Sherri Douglas time: ____________ Date: ____________ Movements: ____________ Start time: ____________ Sherri Douglas time: ____________  Date: ____________ Movements: ____________ Start time: ____________ Sherri Douglas time: ____________ Date: ____________ Movements: ____________ Start time: ____________ Sherri Douglas time: ____________ Date: ____________ Movements: ____________ Start time: ____________ Sherri Douglas time: ____________ Date: ____________ Movements: ____________ Start time: ____________ Sherri Douglas time: ____________ Date: ____________ Movements: ____________ Start time: ____________ Sherri Douglas time: ____________ Date: ____________ Movements: ____________ Start time: ____________ Sherri Douglas time: ____________ Date: ____________ Movements: ____________ Start time: ____________ Sherri Douglas time: ____________  Date: ____________ Movements: ____________ Start time: ____________ Sherri Douglas time: ____________ Date: ____________ Movements: ____________ Start time: ____________ Sherri Douglas time: ____________ Date: ____________ Movements: ____________ Start time: ____________ Sherri Douglas time: ____________ Date: ____________ Movements: ____________ Start time: ____________ Sherri Douglas time: ____________ Date: ____________ Movements: ____________ Start time: ____________ Sherri Douglas time: ____________ Date: ____________ Movements:  ____________ Start time: ____________ Sherri Douglas time:  ____________ Date: ____________ Movements: ____________ Start time: ____________ Sherri Douglas time: ____________  Date: ____________ Movements: ____________ Start time: ____________ Sherri Douglas time: ____________ Date: ____________ Movements: ____________ Start time: ____________ Sherri Douglas time: ____________ Date: ____________ Movements: ____________ Start time: ____________ Sherri Douglas time: ____________ Date: ____________ Movements: ____________ Start time: ____________ Sherri Douglas time: ____________ Date: ____________ Movements: ____________ Start time: ____________ Sherri Douglas time: ____________ Date: ____________ Movements: ____________ Start time: ____________ Sherri Douglas time: ____________ Date: ____________ Movements: ____________ Start time: ____________ Sherri Douglas time: ____________  Date: ____________ Movements: ____________ Start time: ____________ Sherri Douglas time: ____________ Date: ____________ Movements: ____________ Start time: ____________ Sherri Douglas time: ____________ Date: ____________ Movements: ____________ Start time: ____________ Sherri Douglas time: ____________ Date: ____________ Movements: ____________ Start time: ____________ Sherri Douglas time: ____________ Date: ____________ Movements: ____________ Start time: ____________ Sherri Douglas time: ____________ Date: ____________ Movements: ____________ Start time: ____________ Sherri Douglas time: ____________ Date: ____________ Movements: ____________ Start time: ____________ Sherri Douglas time: ____________  Date: ____________ Movements: ____________ Start time: ____________ Sherri Douglas time: ____________ Date: ____________ Movements: ____________ Start time: ____________ Sherri Douglas time: ____________ Date: ____________ Movements: ____________ Start time: ____________ Sherri Douglas time: ____________ Date: ____________ Movements: ____________ Start time: ____________ Sherri Douglas time: ____________ Date: ____________ Movements: ____________ Start time: ____________ Sherri Douglas  time: ____________ Date: ____________ Movements: ____________ Start time: ____________ Sherri Douglas time: ____________ Date: ____________ Movements: ____________ Start time: ____________ Sherri Douglas time: ____________  Date: ____________ Movements: ____________ Start time: ____________ Sherri Douglas time: ____________ Date: ____________ Movements: ____________ Start time: ____________ Sherri Douglas time: ____________ Date: ____________ Movements: ____________ Start time: ____________ Sherri Douglas time: ____________ Date: ____________ Movements: ____________ Start time: ____________ Sherri Douglas time: ____________ Date: ____________ Movements: ____________ Start time: ____________ Sherri Douglas time: ____________ Date: ____________ Movements: ____________ Start time: ____________ Sherri Douglas time: ____________   This information is not intended to replace advice given to you by your health care provider. Make sure you discuss any questions you have with your health care provider.   Document Released: 08/18/2006 Document Revised: 08/09/2014 Document Reviewed: 05/15/2012 Elsevier Interactive Patient Education [employer].

## 2016-02-27 NOTE — MAU Note (Signed)
Had IV iron this am at [facility]. Wanted me to come over for u/s earlier but I had to get back to work. Denies any pain or bleeding

## 2016-02-27 NOTE — MAU Provider Note (Signed)
History     CSN: [id number]  Arrival date and time: 02/27/16 [id number]   First Provider Initiated Contact with Patient 02/27/16 2017      Chief Complaint  Patient presents with  . fetal monitoring   HPI Ms. Sherri Douglas is a 35 y.o. [id number] at [redacted]w[redacted]d who presents to MAU today from [facility] after rapid OB RN reported fetal decelerations on NST during Fereheme infusion this afternoon. She was advised to come to MAU immediately for BPP and did not present until this evening. She denies abdominal pain, contractions, vaginal bleeding, LOF or complications with the pregnancy. She reports good fetal movement.   OB History    Gravida Para Term Preterm AB Living   4 2 2  0 1 2   SAB TAB Ectopic Multiple Live Births   0 1 0 0 1      Past Medical History:  Diagnosis Date  . Abnormal Pap smear   . Anemia   . Chlamydia   . Gestational diabetes   . Trichomoniasis of vagina 2015  . Urinary tract infection     Past Surgical History:  Procedure Laterality Date  . INDUCED ABORTION    . WISDOM TOOTH EXTRACTION      Family History  Problem Relation Age of Onset  . Diabetes Father   . Hypertension Father   . Diabetes Paternal Aunt   . Hypertension Paternal Aunt   . Diabetes Paternal Grandmother   . Hypertension Paternal Grandmother   . Diabetes Paternal Grandfather   . Hypertension Paternal Grandfather   . Anesthesia problems Neg Hx     Social History  Substance Use Topics  . Smoking status: Former Research scientist (life sciences)  . Smokeless tobacco: Never Used  . Alcohol use Yes     Comment: socially    Allergies:  Allergies  Allergen Reactions  . Amoxicillin Rash    Has patient had a PCN reaction causing immediate rash, facial/tongue/throat swelling, SOB or lightheadedness with hypotension: No Has patient had a PCN reaction causing severe rash involving mucus membranes or skin necrosis: Yes Has patient had a PCN reaction that required hospitalization No Has patient had a PCN reaction occurring within  the last 10 years: No If all of the above answers are "NO", then may proceed with Cephalosporin use.     Prescriptions Prior to Admission  Medication Sig Dispense Refill Last Dose  . acetaminophen (TYLENOL) 325 MG tablet Take 325 mg by mouth every 6 (six) hours as needed for moderate pain.   Past Week at Unknown time  . Prenatal MV-Min-Fe Fum-FA-DHA (PRENATAL 1 PO) Take 1 tablet by mouth daily.    02/26/2016 at Unknown time    Review of Systems  Constitutional: Negative for fever and malaise/fatigue.  Gastrointestinal: Negative for abdominal pain.  Genitourinary:       Neg - vaginal bleeding, discharge, LOF   Physical Exam   Blood pressure 116/68, pulse 99, temperature 98 F (36.7 C), resp. rate 18, height 4\' 11"  (1.499 m), weight 159 lb 3.2 oz (72.2 kg), last menstrual period 07/07/2015.  Physical Exam  Nursing note and vitals reviewed. Constitutional: She is oriented to person, place, and time. She appears well-developed and well-nourished. No distress.  HENT:  Head: Normocephalic and atraumatic.  Cardiovascular: Normal rate.   Respiratory: Effort normal.  GI: Soft. She exhibits no distension.  Neurological: She is alert and oriented to person, place, and time.  Skin: Skin is warm and dry. No erythema.  Psychiatric: She has a normal mood  and affect.    Fetal Monitoring: Baseline: 140 bpm Variability: moderate Accelerations: 15 x 15 Decelerations: none Contractions: none  MAU Course  Procedures None  MDM NST reactive BPP ordered - BPP 8/8 Discussed patient with Dr. Alesia Richards, agrees with plan for discharge at this time to follow-up in the office as scheduled.   Assessment and Plan  A: SIUP at [redacted]w[redacted]d Reactive NST  P: Discharge home Preterm labor precautions discussed Patient advised to follow-up with [facility] as scheduled or sooner PRN Patient may return to MAU as needed or if her condition were to change or worsen   Luvenia Redden, PA-C  02/27/2016, 8:17  PM

## 2016-02-27 NOTE — Progress Notes (Signed)
Patient was monitored by [facility] RN before, throughout, and post-infusion. See Progress note. No complications from fereheme noted. Patient is being discharged and [facility].  Ma Hillock, RN

## 2016-03-25 LAB — OB RESULTS CONSOLE GBS: GBS: NEGATIVE

## 2016-03-30 ENCOUNTER — Other Ambulatory Visit (HOSPITAL_BASED_OUTPATIENT_CLINIC_OR_DEPARTMENT_OTHER): Payer: BC Managed Care – PPO

## 2016-03-30 ENCOUNTER — Telehealth: Payer: Self-pay | Admitting: *Deleted

## 2016-03-30 DIAGNOSIS — D509 Iron deficiency anemia, unspecified: Secondary | ICD-10-CM | POA: Diagnosis not present

## 2016-03-30 LAB — CBC & DIFF AND RETIC
BASO%: 0.5 % (ref 0.0–2.0)
BASOS ABS: 0 10*3/uL (ref 0.0–0.1)
EOS ABS: 0 10*3/uL (ref 0.0–0.5)
EOS%: 0.6 % (ref 0.0–7.0)
HEMATOCRIT: 35.4 % (ref 34.8–46.6)
HEMOGLOBIN: 11.1 g/dL — AB (ref 11.6–15.9)
IMMATURE RETIC FRACT: 15 % — AB (ref 1.60–10.00)
LYMPH%: 23.6 % (ref 14.0–49.7)
MCH: 25.9 pg (ref 25.1–34.0)
MCHC: 31.5 g/dL (ref 31.5–36.0)
MCV: 82.3 fL (ref 79.5–101.0)
MONO#: 0.3 10*3/uL (ref 0.1–0.9)
MONO%: 4.2 % (ref 0.0–14.0)
NEUT#: 5.2 10*3/uL (ref 1.5–6.5)
NEUT%: 71.1 % (ref 38.4–76.8)
PLATELETS: 235 10*3/uL (ref 145–400)
RBC: 4.31 10*6/uL (ref 3.70–5.45)
RDW: 33.5 % — ABNORMAL HIGH (ref 11.2–14.5)
Retic %: 1.8 % (ref 0.70–2.10)
Retic Ct Abs: 77.58 10*3/uL (ref 33.70–90.70)
WBC: 7.3 10*3/uL (ref 3.9–10.3)
lymph#: 1.7 10*3/uL (ref 0.9–3.3)

## 2016-03-30 LAB — FERRITIN: Ferritin: 101 ng/ml (ref 9–269)

## 2016-03-30 NOTE — Telephone Encounter (Signed)
Informed pt of lab results and Dr. Calton Dach message/ instructions.  Pt verbalized understanding.

## 2016-03-30 NOTE — Telephone Encounter (Signed)
-----   Message from Heath Lark, MD sent at 03/30/2016 12:34 PM EDT ----- Regarding: labs Pls let her know labs are better. No need to come back Recommend follow-up with PCP after delivery and recheck labs at the end of the year ----- Message ----- From: Interface, Lab In Three Zero One Sent: 03/30/2016  11:05 AM To: Heath Lark, MD

## 2016-04-19 ENCOUNTER — Other Ambulatory Visit (HOSPITAL_COMMUNITY): Payer: Self-pay | Admitting: Obstetrics and Gynecology

## 2016-04-19 ENCOUNTER — Telehealth (HOSPITAL_COMMUNITY): Payer: Self-pay | Admitting: *Deleted

## 2016-04-19 NOTE — H&P (Signed)
Sherri Douglas is a 35 y.o. female G3P2002 at 40 wks and 4 days based on LMP 07/11/2015 confirmed by 12 wk u/s with EDD 04/16/2016 presenting for induction due to post dates. Pregnancy has been compicated by Anemia. Pt received iron transfusion with heme/onc in July 2017. +FM no lof no vaginal bleeding.   Past OB history.  SVD x 2 ( 2005 and 2010)  PMH anemia PGYN hx. Pt denies h/o STD's  Medications: PNV NKDA  Past Surgical History:  Procedure Laterality Date  . INDUCED ABORTION    . WISDOM TOOTH EXTRACTION     Family History: family history includes Diabetes in her father, paternal aunt, paternal grandfather, and paternal grandmother; Hypertension in her father, paternal aunt, paternal grandfather, and paternal grandmother. Social History:  reports that she has quit smoking. She has never used smokeless tobacco. She reports that she drinks alcohol. She reports that she does not use drugs.     Maternal Diabetes: No Genetic Screening: Declined Maternal Ultrasounds/Referrals: Declined Fetal Ultrasounds or other Referrals:  None Maternal Substance Abuse:  No Significant Maternal Medications:  None Significant Maternal Lab Results:  Lab values include: Group B Strep negative Other Comments:  None  Review of Systems  Constitutional: Negative.   HENT: Negative.   Eyes: Negative.   Respiratory: Negative.   Cardiovascular: Negative.   Gastrointestinal: Negative.   Genitourinary: Negative.   Musculoskeletal: Negative.   Skin: Negative.   Neurological: Negative.   Endo/Heme/Allergies: Negative.   Psychiatric/Behavioral: Negative.    Maternal Medical History:  Fetal activity: Perceived fetal activity is normal.    Prenatal Complications - Diabetes: none.      Last menstrual period 07/07/2015. Maternal Exam:  Abdomen: Patient reports no abdominal tenderness. Estimated fetal weight is 6 lbs 4 oz on ultrasound 04/12/2016.   Fetal presentation: vertex     Physical Exam   Vitals reviewed. Constitutional: She is oriented to person, place, and time. She appears well-developed and well-nourished.  HENT:  Head: Normocephalic and atraumatic.  Eyes: Conjunctivae are normal. Pupils are equal, round, and reactive to light.  Neck: Normal range of motion. Neck supple.  Cardiovascular: Normal rate and regular rhythm.   Respiratory: Effort normal and breath sounds normal.  GI: There is no tenderness.  Genitourinary: Vagina normal and uterus normal.  Musculoskeletal: Normal range of motion. She exhibits no edema.  Neurological: She is alert and oriented to person, place, and time. She has normal reflexes.  Skin: Skin is warm and dry.  Psychiatric: She has a normal mood and affect.    Prenatal labs: ABO, Rh: --/--/B POS (02/06 1114) Antibody:  Negative  Rubella:  Immune  RPR:   Nonreactive HBsAg:   Negative  HIV: Non Reactive (02/06 1116)  GBS:   Negative   Assessment/Plan: 40 wks and 4 days post dates for induction.  Pitocin for induction Anemia- Type and Screen/CBC on Admission  Anticipate SVD  CCOB covering from midnight to 7 am.    Sherri Douglas J. 04/19/2016, 6:04 PM

## 2016-04-19 NOTE — Telephone Encounter (Signed)
Preadmission screen  

## 2016-04-20 ENCOUNTER — Inpatient Hospital Stay (HOSPITAL_COMMUNITY): Payer: BC Managed Care – PPO | Admitting: Anesthesiology

## 2016-04-20 ENCOUNTER — Inpatient Hospital Stay (HOSPITAL_COMMUNITY)
Admission: RE | Admit: 2016-04-20 | Discharge: 2016-04-22 | DRG: 775 | Disposition: A | Discharge status: Home / Self Care | Payer: BC Managed Care – PPO | Source: Ambulatory Visit | Attending: Obstetrics and Gynecology | Admitting: Obstetrics and Gynecology

## 2016-04-20 ENCOUNTER — Encounter (HOSPITAL_COMMUNITY): Payer: Self-pay

## 2016-04-20 DIAGNOSIS — Z3A41 41 weeks gestation of pregnancy: Secondary | ICD-10-CM | POA: Diagnosis not present

## 2016-04-20 DIAGNOSIS — O288 Other abnormal findings on antenatal screening of mother: Secondary | ICD-10-CM

## 2016-04-20 DIAGNOSIS — O480 Post-term pregnancy: Secondary | ICD-10-CM | POA: Diagnosis present

## 2016-04-20 LAB — CBC
HEMATOCRIT: 37 % (ref 36.0–46.0)
Hemoglobin: 12.4 g/dL (ref 12.0–15.0)
MCH: 28.2 pg (ref 26.0–34.0)
MCHC: 33.5 g/dL (ref 30.0–36.0)
MCV: 84.1 fL (ref 78.0–100.0)
PLATELETS: 254 10*3/uL (ref 150–400)
RBC: 4.4 MIL/uL (ref 3.87–5.11)
WBC: 8.5 10*3/uL (ref 4.0–10.5)

## 2016-04-20 LAB — TYPE AND SCREEN
ABO/RH(D): B POS
Antibody Screen: NEGATIVE

## 2016-04-20 LAB — RPR: RPR: NONREACTIVE

## 2016-04-20 MED ORDER — ACETAMINOPHEN 325 MG PO TABS
650.0000 mg | ORAL_TABLET | ORAL | Status: DC | PRN
  Administered 2016-04-20 – 2016-04-21 (×2): 650 mg via ORAL
  Filled 2016-04-20 (×2): qty 2

## 2016-04-20 MED ORDER — ONDANSETRON HCL 4 MG/2ML IJ SOLN: 4.0000 mg | Freq: Four times a day (QID) | INTRAMUSCULAR | Status: DC | PRN

## 2016-04-20 MED ORDER — ONDANSETRON HCL 4 MG/2ML IJ SOLN: 4.0000 mg | INTRAMUSCULAR | Status: DC | PRN

## 2016-04-20 MED ORDER — OXYCODONE HCL 5 MG PO TABS
5.0000 mg | ORAL_TABLET | ORAL | Status: DC | PRN
  Administered 2016-04-20 – 2016-04-22 (×7): 5 mg via ORAL
  Filled 2016-04-20 (×6): qty 1

## 2016-04-20 MED ORDER — SOD CITRATE-CITRIC ACID 500-334 MG/5ML PO SOLN: 30.0000 mL | ORAL | Status: DC | PRN

## 2016-04-20 MED ORDER — OXYTOCIN BOLUS FROM INFUSION: 500.0000 mL | Freq: Once | INTRAVENOUS | Status: DC

## 2016-04-20 MED ORDER — ZOLPIDEM TARTRATE 5 MG PO TABS: 5.0000 mg | ORAL_TABLET | Freq: Every evening | ORAL | Status: DC | PRN

## 2016-04-20 MED ORDER — COCONUT OIL OIL: 1.0000 "application " | TOPICAL_OIL | Status: DC | PRN

## 2016-04-20 MED ORDER — PHENYLEPHRINE 40 MCG/ML (10ML) SYRINGE FOR IV PUSH (FOR BLOOD PRESSURE SUPPORT)
80.0000 ug | PREFILLED_SYRINGE | INTRAVENOUS | Status: DC | PRN
  Filled 2016-04-20: qty 5
  Filled 2016-04-20: qty 10

## 2016-04-20 MED ORDER — EPHEDRINE 5 MG/ML INJ
10.0000 mg | INTRAVENOUS | Status: DC | PRN
  Filled 2016-04-20: qty 4

## 2016-04-20 MED ORDER — OXYTOCIN 40 UNITS IN LACTATED RINGERS INFUSION - SIMPLE MED
2.5000 [IU]/h | INTRAVENOUS | Status: DC
  Filled 2016-04-20: qty 1000

## 2016-04-20 MED ORDER — WITCH HAZEL-GLYCERIN EX PADS
1.0000 | MEDICATED_PAD | CUTANEOUS | Status: DC | PRN
Start: 2016-04-20 — End: 2016-04-22

## 2016-04-20 MED ORDER — PHENYLEPHRINE 40 MCG/ML (10ML) SYRINGE FOR IV PUSH (FOR BLOOD PRESSURE SUPPORT)
80.0000 ug | PREFILLED_SYRINGE | INTRAVENOUS | Status: DC | PRN
  Filled 2016-04-20: qty 5

## 2016-04-20 MED ORDER — SIMETHICONE 80 MG PO CHEW: 80.0000 mg | CHEWABLE_TABLET | ORAL | Status: DC | PRN

## 2016-04-20 MED ORDER — OXYCODONE HCL 5 MG PO TABS
10.0000 mg | ORAL_TABLET | ORAL | Status: DC | PRN
  Administered 2016-04-21 (×2): 10 mg via ORAL
  Filled 2016-04-20 (×2): qty 2

## 2016-04-20 MED ORDER — ACETAMINOPHEN 325 MG PO TABS: 650.0000 mg | ORAL_TABLET | ORAL | Status: DC | PRN

## 2016-04-20 MED ORDER — METHYLERGONOVINE MALEATE 0.2 MG PO TABS: 0.2000 mg | ORAL_TABLET | ORAL | Status: DC | PRN

## 2016-04-20 MED ORDER — OXYTOCIN 40 UNITS IN LACTATED RINGERS INFUSION - SIMPLE MED
1.0000 m[IU]/min | INTRAVENOUS | Status: DC
  Administered 2016-04-20: 2 m[IU]/min via INTRAVENOUS
  Administered 2016-04-20: 4 m[IU]/min via INTRAVENOUS

## 2016-04-20 MED ORDER — BENZOCAINE-MENTHOL 20-0.5 % EX AERO
1.0000 "application " | INHALATION_SPRAY | CUTANEOUS | Status: DC | PRN
  Administered 2016-04-20: 1 via TOPICAL
  Filled 2016-04-20: qty 56

## 2016-04-20 MED ORDER — SENNOSIDES-DOCUSATE SODIUM 8.6-50 MG PO TABS
2.0000 | ORAL_TABLET | ORAL | Status: DC
  Administered 2016-04-20 – 2016-04-21 (×2): 2 via ORAL
  Filled 2016-04-20: qty 2

## 2016-04-20 MED ORDER — OXYCODONE-ACETAMINOPHEN 5-325 MG PO TABS: 2.0000 | ORAL_TABLET | ORAL | Status: DC | PRN

## 2016-04-20 MED ORDER — IBUPROFEN 600 MG PO TABS
600.0000 mg | ORAL_TABLET | Freq: Four times a day (QID) | ORAL | Status: DC
  Administered 2016-04-20 – 2016-04-22 (×7): 600 mg via ORAL
  Filled 2016-04-20 (×6): qty 1

## 2016-04-20 MED ORDER — LACTATED RINGERS IV SOLN
500.0000 mL | Freq: Once | INTRAVENOUS | Status: AC
  Administered 2016-04-20: 500 mL via INTRAVENOUS

## 2016-04-20 MED ORDER — METHYLERGONOVINE MALEATE 0.2 MG/ML IJ SOLN: 0.2000 mg | INTRAMUSCULAR | Status: DC | PRN

## 2016-04-20 MED ORDER — LIDOCAINE HCL (PF) 1 % IJ SOLN
INTRAMUSCULAR | Status: DC | PRN
  Administered 2016-04-20: 5 mL via EPIDURAL
  Administered 2016-04-20: 6 mL via EPIDURAL

## 2016-04-20 MED ORDER — LACTATED RINGERS IV SOLN: 500.0000 mL | INTRAVENOUS | Status: DC | PRN

## 2016-04-20 MED ORDER — DIPHENHYDRAMINE HCL 50 MG/ML IJ SOLN: 12.5000 mg | INTRAMUSCULAR | Status: DC | PRN

## 2016-04-20 MED ORDER — ONDANSETRON HCL 4 MG PO TABS: 4.0000 mg | ORAL_TABLET | ORAL | Status: DC | PRN

## 2016-04-20 MED ORDER — OXYCODONE-ACETAMINOPHEN 5-325 MG PO TABS: 1.0000 | ORAL_TABLET | ORAL | Status: DC | PRN

## 2016-04-20 MED ORDER — DIPHENHYDRAMINE HCL 25 MG PO CAPS: 25.0000 mg | ORAL_CAPSULE | Freq: Four times a day (QID) | ORAL | Status: DC | PRN

## 2016-04-20 MED ORDER — FENTANYL 2.5 MCG/ML BUPIVACAINE 1/10 % EPIDURAL INFUSION (WH - ANES)
14.0000 mL/h | INTRAMUSCULAR | Status: DC | PRN
  Administered 2016-04-20: 14 mL/h via EPIDURAL
  Filled 2016-04-20: qty 125

## 2016-04-20 MED ORDER — TERBUTALINE SULFATE 1 MG/ML IJ SOLN
0.2500 mg | Freq: Once | INTRAMUSCULAR | Status: DC | PRN
  Filled 2016-04-20: qty 1

## 2016-04-20 MED ORDER — FENTANYL CITRATE (PF) 100 MCG/2ML IJ SOLN: 50.0000 ug | INTRAMUSCULAR | Status: DC | PRN

## 2016-04-20 MED ORDER — DIBUCAINE 1 % RE OINT: 1.0000 "application " | TOPICAL_OINTMENT | RECTAL | Status: DC | PRN

## 2016-04-20 MED ORDER — LIDOCAINE HCL (PF) 1 % IJ SOLN
30.0000 mL | INTRAMUSCULAR | Status: DC | PRN
  Filled 2016-04-20: qty 30

## 2016-04-20 MED ORDER — PRENATAL MULTIVITAMIN CH
1.0000 | ORAL_TABLET | Freq: Every day | ORAL | Status: DC
  Administered 2016-04-21: 1 via ORAL
  Filled 2016-04-20: qty 1

## 2016-04-20 MED ORDER — LACTATED RINGERS IV SOLN
INTRAVENOUS | Status: DC
  Administered 2016-04-20: 10:00:00 via INTRAVENOUS

## 2016-04-20 NOTE — Anesthesia Procedure Notes (Signed)
Epidural Patient location during procedure: OB Start time: 04/20/2016 9:16 AM End time: 04/20/2016 9:20 AM  Staffing Anesthesiologist: Lyn Hollingshead Performed: anesthesiologist   Preanesthetic Checklist Completed: patient identified, surgical consent, pre-op evaluation, timeout performed, IV checked, risks and benefits discussed and monitors and equipment checked  Epidural Patient position: sitting Prep: site prepped and draped and DuraPrep Patient monitoring: continuous pulse ox and blood pressure Approach: midline Location: L3-L4 Injection technique: LOR air  Needle:  Needle type: Tuohy  Needle gauge: 17 G Needle length: 9 cm and 9 Needle insertion depth: 4 cm Catheter type: closed end flexible Catheter size: 19 Gauge Catheter at skin depth: 9 cm Test dose: negative and Other  Assessment Sensory level: T10 Events: blood not aspirated, injection not painful, no injection resistance, negative IV test and no paresthesia  Additional Notes Reason for block:procedure for pain

## 2016-04-20 NOTE — Anesthesia Postprocedure Evaluation (Signed)
Anesthesia Post Note  Patient: Sherri Douglas  Procedure(s) Performed: * No procedures listed *  Patient location during evaluation: Mother Baby Anesthesia Type: Epidural Level of consciousness: awake and alert Pain management: pain level controlled Vital Signs Assessment: post-procedure vital signs reviewed and stable Respiratory status: spontaneous breathing and nonlabored ventilation Cardiovascular status: stable Postop Assessment: epidural receding, adequate PO intake, no signs of nausea or vomiting, no backache, no headache and patient able to bend at knees Anesthetic complications: no     Last Vitals:  Vitals:   04/20/16 1607 04/20/16 1812  BP: 132/71 125/73  Pulse: 78 82  Resp: (!) 22 20  Temp: 36.6 C 36.9 C    Last Pain:  Vitals:   04/20/16 1812  TempSrc: Oral  PainSc:    Pain Goal: Patients Stated Pain Goal: 0 (04/20/16 1811)               Jabier Mutton

## 2016-04-20 NOTE — Progress Notes (Signed)
Sherri Douglas is a 35 y.o. [id number] at [redacted]w[redacted]d by LMP admitted for induction of labor due to Post dates. Due date 04/16/2016.  Subjective: Patient considering epidural. Contractions moderate . No lof no vaginal bleeding +FM  Objective: BP 120/84   Pulse 76   Temp 98.3 F (36.8 C)   Resp 16   Ht 4\' 11"  (1.499 m)   Wt 160 lb (72.6 kg)   LMP 07/07/2015   SpO2 100%   BMI 32.32 kg/m  No intake/output data recorded. Total I/O In: -  Out: 150 [Blood:150]  FHT:  Category 1 UC:  Every 3-4 minutes  SVE: 2/75/-2 Labs: Lab Results  Component Value Date   WBC 8.5 04/20/2016   HGB 12.4 04/20/2016   HCT 37.0 04/20/2016   MCV 84.1 04/20/2016   PLT 254 04/20/2016    Assessment / Plan: Induction of labor due to postterm,  progressing well on pitocin  Labor: Cotninue pitocin then AROM  Preeclampsia:  NA Fetal Wellbeing:  Category I Pain Control:  Epidural I/D:  n/a Anticipated MOD:  NSVD  Sherri Douglas J. 04/20/2016, 1:49 PM

## 2016-04-20 NOTE — Anesthesia Preprocedure Evaluation (Signed)
Anesthesia Evaluation  Patient identified by MRN, date of birth, ID band Patient awake    Reviewed: Allergy & Precautions, H&P , NPO status , Patient's Chart, lab work & pertinent test results  Airway Mallampati: II  TM Distance: >3 FB Neck ROM: full    Dental no notable dental hx.    Pulmonary neg pulmonary ROS, former smoker,    Pulmonary exam normal        Cardiovascular negative cardio ROS Normal cardiovascular exam     Neuro/Psych negative neurological ROS  negative psych ROS   GI/Hepatic negative GI ROS, Neg liver ROS,   Endo/Other  diabetes  Renal/GU negative Renal ROS     Musculoskeletal   Abdominal (+) + obese,   Peds  Hematology   Anesthesia Other Findings   Reproductive/Obstetrics (+) Pregnancy                             Anesthesia Physical Anesthesia Plan  ASA: II  Anesthesia Plan: Epidural   Post-op Pain Management:    Induction:   Airway Management Planned:   Additional Equipment:   Intra-op Plan:   Post-operative Plan:   Informed Consent: I have reviewed the patients History and Physical, chart, labs and discussed the procedure including the risks, benefits and alternatives for the proposed anesthesia with the patient or authorized representative who has indicated his/her understanding and acceptance.     Plan Discussed with:   Anesthesia Plan Comments:         Anesthesia Quick Evaluation

## 2016-04-21 ENCOUNTER — Inpatient Hospital Stay (HOSPITAL_COMMUNITY): Admission: RE | Admit: 2016-04-21 | Payer: BC Managed Care – PPO | Source: Ambulatory Visit

## 2016-04-21 LAB — CBC
HCT: 33.8 % — ABNORMAL LOW (ref 36.0–46.0)
Hemoglobin: 11.3 g/dL — ABNORMAL LOW (ref 12.0–15.0)
MCH: 28.5 pg (ref 26.0–34.0)
MCHC: 33.4 g/dL (ref 30.0–36.0)
MCV: 85.4 fL (ref 78.0–100.0)
PLATELETS: 219 10*3/uL (ref 150–400)
RBC: 3.96 MIL/uL (ref 3.87–5.11)
WBC: 7.8 10*3/uL (ref 4.0–10.5)

## 2016-04-21 NOTE — Progress Notes (Signed)
Post Partum Day 1 SVD Subjective: up ad lib, voiding, tolerating PO and + flatus  Objective: Blood pressure 129/79, pulse 79, temperature 98.1 F (36.7 C), temperature source Oral, resp. rate 16, height 4\' 11"  (1.499 m), weight 160 lb (72.6 kg), last menstrual period 07/07/2015, SpO2 100 %, unknown if currently breastfeeding.  Physical Exam:  General: alert and cooperative Lochia: appropriate Uterine Fundus: firm Incision: NA DVT Evaluation: No evidence of DVT seen on physical exam.   Recent Labs  04/20/16 0305 04/21/16 0529  HGB 12.4 11.3*  HCT 37.0 33.8*    Assessment/Plan: Plan for discharge tomorrow  Routine postpartum care    LOS: 1 day   Erikah Thumm J. 04/21/2016, 8:21 AM

## 2016-04-22 MED ORDER — IBUPROFEN 600 MG PO TABS: 600.0000 mg | ORAL_TABLET | Freq: Four times a day (QID) | ORAL | 1 refills | Status: DC | PRN

## 2016-04-22 MED ORDER — OXYCODONE HCL 5 MG PO TABS: 5.0000 mg | ORAL_TABLET | ORAL | 0 refills | Status: DC | PRN

## 2016-04-22 NOTE — Discharge Summary (Signed)
OB Discharge Summary     Patient Name: Sherri Douglas DOB: [birth year] MRN: [id number]  Date of admission: 04/20/2016 Delivering MD: Christophe Louis   Date of discharge: 04/22/2016  Admitting diagnosis: INDUCTION Intrauterine pregnancy: [redacted]w[redacted]d     Secondary diagnosis:  Principal Problem:   Vaginal delivery Active Problems:   Post-dates pregnancy  Additional problems: None     Discharge diagnosis: Term Pregnancy Delivered                                                                                                Post partum procedures:None  Augmentation: Pitocin   Complications: None  Hospital course:  Induction of Labor With Vaginal Delivery   35 y.o. yo [phone] at [redacted]w[redacted]d was admitted to the hospital 04/20/2016 for induction of labor.  Indication for induction: Postdates.  Patient had an uncomplicated labor course as follows: Membrane Rupture Time/Date: 12:38 PM ,04/20/2016   Intrapartum Procedures: Episiotomy: None [1]                                         Lacerations:  1st degree [2];Vaginal [6]  Patient had delivery of a Viable infant.  Information for the patient's newborn:  Joletta, Perrier [id number]  Delivery Method: Vaginal, Spontaneous Delivery (Filed from Delivery Summary)   04/20/2016  Details of delivery can be found in separate delivery note.  Patient had a routine postpartum course. Patient is discharged home 04/22/16.   Physical exam Vitals:   04/20/16 2020 04/21/16 1240 04/21/16 1834 04/22/16 0555  BP: 129/79 118/75 126/70 119/78  Pulse: 79 74 66 66  Resp: 16 18 18 18   Temp: 98.1 F (36.7 C) 98.4 F (36.9 C)  97.9 F (36.6 C)  TempSrc: Oral Oral  Oral  SpO2:      Weight:      Height:       General: alert, cooperative and no distress Lochia: appropriate Uterine Fundus: firm Incision: N/A DVT Evaluation: No evidence of DVT seen on physical exam. Labs: Lab Results  Component Value Date   WBC 7.8 04/21/2016   HGB 11.3 (L) 04/21/2016   HCT 33.8  (L) 04/21/2016   MCV 85.4 04/21/2016   PLT 219 04/21/2016   CMP Latest Ref Rng & Units 04/10/2015  Glucose 65 - 99 mg/dL 90  BUN 6 - 20 mg/dL 14  Creatinine 0.44 - 1.00 mg/dL 0.59  Sodium 135 - 145 mmol/L 134(L)  Potassium 3.5 - 5.1 mmol/L 4.0  Chloride 101 - 111 mmol/L 105  CO2 22 - 32 mmol/L 22  Calcium 8.9 - 10.3 mg/dL 8.9    Discharge instruction: per After Visit Summary and "Baby and Me Booklet".  After visit meds:    Medication List    TAKE these medications   ibuprofen 600 MG tablet Commonly known as:  ADVIL,MOTRIN Take 1 tablet (600 mg total) by mouth every 6 (six) hours as needed.   oxyCODONE 5 MG immediate release tablet Commonly known as:  Oxy IR/ROXICODONE Take 1 tablet (  5 mg total) by mouth every 4 (four) hours as needed (pain scale 4-7).   PRENATAL 1 PO Take 1 tablet by mouth daily.       Diet: routine diet  Activity: Advance as tolerated. Pelvic rest for 6 weeks.   Outpatient follow up:6 weeks Follow up Appt:No future appointments. Follow up Visit:No Follow-up on file.  Postpartum contraception: None  Newborn Data: Live born female  Birth Weight: 5 lb 4.5 oz (2395 g) APGAR: 8, 9  Baby Feeding: Bottle Disposition:home with mother   04/22/2016 Catha Brow., MD

## 2016-04-22 NOTE — Progress Notes (Signed)
Pt teaching complete  Ambulated out with baby and family member

## 2016-04-28 ENCOUNTER — Inpatient Hospital Stay (HOSPITAL_COMMUNITY)
Admission: AD | Admit: 2016-04-28 | Discharge: 2016-04-30 | DRG: 776 | Disposition: A | Discharge status: Home / Self Care | Payer: BC Managed Care – PPO | Source: Ambulatory Visit | Attending: Obstetrics and Gynecology | Admitting: Obstetrics and Gynecology

## 2016-04-28 ENCOUNTER — Encounter (HOSPITAL_COMMUNITY): Payer: Self-pay

## 2016-04-28 DIAGNOSIS — Z87891 Personal history of nicotine dependence: Secondary | ICD-10-CM

## 2016-04-28 DIAGNOSIS — Z79899 Other long term (current) drug therapy: Secondary | ICD-10-CM

## 2016-04-28 DIAGNOSIS — R609 Edema, unspecified: Secondary | ICD-10-CM | POA: Diagnosis present

## 2016-04-28 DIAGNOSIS — O1415 Severe pre-eclampsia, complicating the puerperium: Principal | ICD-10-CM | POA: Diagnosis present

## 2016-04-28 DIAGNOSIS — R51 Headache: Secondary | ICD-10-CM | POA: Diagnosis present

## 2016-04-28 DIAGNOSIS — O1495 Unspecified pre-eclampsia, complicating the puerperium: Secondary | ICD-10-CM | POA: Diagnosis present

## 2016-04-28 LAB — COMPREHENSIVE METABOLIC PANEL
ALBUMIN: 3.4 g/dL — AB (ref 3.5–5.0)
ALK PHOS: 62 U/L (ref 38–126)
ALT: 24 U/L (ref 14–54)
AST: 19 U/L (ref 15–41)
Anion gap: 7 (ref 5–15)
BILIRUBIN TOTAL: 0.2 mg/dL — AB (ref 0.3–1.2)
BUN: 14 mg/dL (ref 6–20)
CALCIUM: 8.9 mg/dL (ref 8.9–10.3)
CO2: 22 mmol/L (ref 22–32)
CREATININE: 0.58 mg/dL (ref 0.44–1.00)
Chloride: 106 mmol/L (ref 101–111)
GFR calc Af Amer: >60 mL/min (ref >60)
GLUCOSE: 92 mg/dL (ref 65–99)
POTASSIUM: 3.9 mmol/L (ref 3.5–5.1)
Sodium: 135 mmol/L (ref 135–145)
TOTAL PROTEIN: 6.9 g/dL (ref 6.5–8.1)

## 2016-04-28 LAB — CBC
HEMATOCRIT: 38.8 % (ref 36.0–46.0)
HEMOGLOBIN: 12.8 g/dL (ref 12.0–15.0)
MCH: 28.1 pg (ref 26.0–34.0)
MCHC: 33 g/dL (ref 30.0–36.0)
MCV: 85.1 fL (ref 78.0–100.0)
Platelets: 276 10*3/uL (ref 150–400)
RBC: 4.56 MIL/uL (ref 3.87–5.11)
RDW: 23.1 % — AB (ref 11.5–15.5)
WBC: 5.5 10*3/uL (ref 4.0–10.5)

## 2016-04-28 LAB — PROTEIN / CREATININE RATIO, URINE
Creatinine, Urine: 142 mg/dL
Protein Creatinine Ratio: 0.07 mg/mg{Cre} (ref 0.00–0.15)
TOTAL PROTEIN, URINE: 10 mg/dL

## 2016-04-28 LAB — LACTATE DEHYDROGENASE: LDH: 230 U/L — AB (ref 98–192)

## 2016-04-28 LAB — URIC ACID: Uric Acid, Serum: 5 mg/dL (ref 2.3–6.6)

## 2016-04-28 MED ORDER — LACTATED RINGERS IV SOLN
INTRAVENOUS | Status: DC
  Administered 2016-04-28 – 2016-04-29 (×3): via INTRAVENOUS

## 2016-04-28 MED ORDER — SODIUM CHLORIDE 0.9% FLUSH
3.0000 mL | Freq: Two times a day (BID) | INTRAVENOUS | Status: DC
  Administered 2016-04-29: 3 mL via INTRAVENOUS

## 2016-04-28 MED ORDER — SODIUM CHLORIDE 0.9% FLUSH: 3.0000 mL | INTRAVENOUS | Status: DC | PRN

## 2016-04-28 MED ORDER — SODIUM CHLORIDE 0.9 % IV SOLN: 250.0000 mL | INTRAVENOUS | Status: DC | PRN

## 2016-04-28 MED ORDER — FUROSEMIDE 10 MG/ML IJ SOLN
20.0000 mg | Freq: Once | INTRAMUSCULAR | Status: AC
  Administered 2016-04-28: 20 mg via INTRAVENOUS
  Filled 2016-04-28: qty 2

## 2016-04-28 MED ORDER — LABETALOL HCL 5 MG/ML IV SOLN
20.0000 mg | INTRAVENOUS | Status: DC | PRN
  Administered 2016-04-28: 20 mg via INTRAVENOUS
  Filled 2016-04-28: qty 4
  Filled 2016-04-28: qty 8

## 2016-04-28 MED ORDER — OXYCODONE-ACETAMINOPHEN 5-325 MG PO TABS
1.0000 | ORAL_TABLET | ORAL | Status: DC | PRN
  Administered 2016-04-29 (×3): 2 via ORAL
  Administered 2016-04-30 (×2): 1 via ORAL
  Filled 2016-04-28: qty 1
  Filled 2016-04-28: qty 2

## 2016-04-28 MED ORDER — OXYCODONE-ACETAMINOPHEN 5-325 MG PO TABS
1.0000 | ORAL_TABLET | ORAL | Status: DC | PRN
  Administered 2016-04-28 (×2): 1 via ORAL
  Filled 2016-04-28: qty 2
  Filled 2016-04-28 (×2): qty 1
  Filled 2016-04-28 (×2): qty 2

## 2016-04-28 MED ORDER — MAGNESIUM SULFATE BOLUS VIA INFUSION
4.0000 g | Freq: Once | INTRAVENOUS | Status: AC
  Administered 2016-04-28: 4 g via INTRAVENOUS
  Filled 2016-04-28: qty 500

## 2016-04-28 MED ORDER — HYDRALAZINE HCL 20 MG/ML IJ SOLN
10.0000 mg | Freq: Once | INTRAMUSCULAR | Status: AC | PRN
  Administered 2016-04-28: 10 mg via INTRAVENOUS
  Filled 2016-04-28: qty 1

## 2016-04-28 MED ORDER — MAGNESIUM SULFATE 50 % IJ SOLN
2.0000 g/h | INTRAVENOUS | Status: AC
  Administered 2016-04-29: 2 g/h via INTRAVENOUS
  Filled 2016-04-28 (×2): qty 80

## 2016-04-28 MED ORDER — HYDRALAZINE HCL 20 MG/ML IJ SOLN: 5.0000 mg | INTRAMUSCULAR | Status: DC | PRN

## 2016-04-28 MED ORDER — LABETALOL HCL 5 MG/ML IV SOLN
20.0000 mg | INTRAVENOUS | Status: DC | PRN
Start: 2016-04-28 — End: 2016-04-30
  Administered 2016-04-28: 40 mg via INTRAVENOUS

## 2016-04-28 NOTE — Progress Notes (Signed)
Postpartum day #8, NSVD.  Readmitted for Postpartum preeclampsia and headache.  Received Labetalol 20 mg and 40 mg in MAU.  Subjective Pt states headache resolved when she received Labetalol IV and BP came down.  Feels like it is coming back. BP is currently 140s/80s. She has not had Percocet yet. Denies visual changes.  Pt has been crying a lot.  Temp:  [98.4 F (36.9 C)] 98.4 F (36.9 C) (09/27 1258) Pulse Rate:  [60-100] 80 (09/27 1701) Resp:  [17-20] 18 (09/27 1625) BP: (118-183)/(74-117) 135/86 (09/27 1701) Weight:  [72.6 kg (160 lb)] 72.6 kg (160 lb) (09/27 1322) UOP: 700 x 1 Gen:  NAD, A&O x 3, Eyelids edematous.  Pt tearful. Abd:  No RUQ pain. Ext:  +Edema, no calf tenderness bilaterally  CBC    Component Value Date/Time   WBC 5.5 04/28/2016 1323   RBC 4.56 04/28/2016 1323   HGB 12.8 04/28/2016 1323   HGB 11.1 (L) 03/30/2016 1051   HCT 38.8 04/28/2016 1323   HCT 35.4 03/30/2016 1051   PLT 276 04/28/2016 1323   PLT 235 03/30/2016 1051   MCV 85.1 04/28/2016 1323   MCV 82.3 03/30/2016 1051   MCH 28.1 04/28/2016 1323   MCHC 33.0 04/28/2016 1323   RDW 23.1 (H) 04/28/2016 1323   RDW 33.5 (H) 03/30/2016 1051   LYMPHSABS 1.7 03/30/2016 1051   MONOABS 0.3 03/30/2016 1051   EOSABS 0.0 03/30/2016 1051   BASOSABS 0.0 03/30/2016 1051   PCR 0.07  A/P: Severe postpartum hypertension c/w Preeclampsia by history and exam but protein negative. On Magnesium sulfate x 24 hours. Start Lasix IV  X 2 doses.  Consider discharge on diuretic. Repeat  Labs in am. [facility] on tonight at 7 PM. Dr. Nelda Marseille to assume care at 7 am.  Thurnell Lose 04/28/2016, 6:05 PM

## 2016-04-28 NOTE — MAU Note (Signed)
Having really really bad headaches, not normal headache,  Pain meds not helping.  No hx of BP problems.  Denies visual changes, feet are a little swollen, denies epigastric pain

## 2016-04-28 NOTE — Progress Notes (Signed)
NP notified of BP recording after medications given. NP requests pt received 40mg  IV Labetolol now.

## 2016-04-28 NOTE — H&P (Addendum)
Sherri Douglas is an 35 y.o. female. G4 P3013 PPD # 7, s/p SVD.presented to office today with c/o severe headache x 1 week.  Pt states it is throbbing.  Denies visual changes but feels pressure in her eyes.  Denies n/v, RUQ, SOB or epigastric pain.  Pt reported swelling in lower extremities 2 days ago.  States the swelling has reduced slightly.  Pt denies h/o HTN or preeclampsia with previous pregnancies.  Pt has had prenatal care with [facility] Richardson [facility]ole [facility]. Pregnancy was complicated by severe anemia, requiring blood transfusion, AMA.   Menstrual History:  No LMP recorded.    Past Medical History:  Diagnosis Date  . Abnormal Pap smear   . Anemia   . Chlamydia   . Gestational diabetes   . Trichomoniasis of vagina 2015  . Urinary tract infection     Past Surgical History:  Procedure Laterality Date  . INDUCED ABORTION    . WISDOM TOOTH EXTRACTION      Family History  Problem Relation Age of Onset  . Diabetes Father   . Hypertension Father   . Diabetes Paternal Aunt   . Hypertension Paternal Aunt   . Diabetes Paternal Grandmother   . Hypertension Paternal Grandmother   . Diabetes Paternal Grandfather   . Hypertension Paternal Grandfather   . Anesthesia problems Neg Hx     Social History:  reports that she has quit smoking. She has never used smokeless tobacco. She reports that she drinks alcohol. She reports that she does not use drugs.  Allergies:  Allergies  Allergen Reactions  . Amoxicillin Rash    Has patient had a PCN reaction causing immediate rash, facial/tongue/throat swelling, SOB or lightheadedness with hypotension: No Has patient had a PCN reaction causing severe rash involving mucus membranes or skin necrosis: Yes Has patient had a PCN reaction that required hospitalization No Has patient had a PCN reaction occurring within the last 10 years: No If all of the above answers are "NO", then may proceed with Cephalosporin use.     Prescriptions Prior to  Admission  Medication Sig Dispense Refill Last Dose  . ibuprofen (ADVIL,MOTRIN) 600 MG tablet Take 1 tablet (600 mg total) by mouth every 6 (six) hours as needed. (Patient taking differently: Take 600 mg by mouth every 6 (six) hours as needed for moderate pain. ) 30 tablet 1 04/28/2016 at Unknown time  . oxyCODONE (OXY IR/ROXICODONE) 5 MG immediate release tablet Take 1 tablet (5 mg total) by mouth every 4 (four) hours as needed (pain scale 4-7). 12 tablet 0 Past Week at Unknown time  . Prenatal MV-Min-Fe Fum-FA-DHA (PRENATAL 1 PO) Take 1 tablet by mouth daily.    04/27/2016 at Unknown time    Review of Systems  Constitutional: Negative for chills and fever.  Gastrointestinal: Negative for nausea.  Neurological: Positive for headaches.    Blood pressure 136/86, pulse 69, temperature 98.4 F (36.9 C), temperature source Oral, resp. rate 20, height 4\' 11"  (1.499 m), weight 72.6 kg (160 lb), not currently breastfeeding.  BP 136/92, Increased to 184/117  Physical Exam  Constitutional: She is oriented to person, place, and time. She appears well-developed and well-nourished.  Pt is tearful.  HENT:  Head: Normocephalic and atraumatic.  Eyes: EOM are normal.  Neck: Normal range of motion.  Cardiovascular: Normal rate and regular rhythm.   Respiratory: Breath sounds normal. She is in respiratory distress. She has no wheezes.  GI: There is no tenderness.  Musculoskeletal: She exhibits edema.  She exhibits no tenderness.  Neurological: She is alert and oriented to person, place, and time. She has normal reflexes.  1+ DTR  Skin: Skin is warm and dry.  Psychiatric: She has a normal mood and affect.    Results for orders placed or performed during the hospital encounter of 04/28/16 (from the past 24 hour(s))  Protein / creatinine ratio, urine     Status: None   Collection Time: 04/28/16  1:00 PM  Result Value Ref Range   Creatinine, Urine 142.00 mg/dL   Total Protein, Urine 10 mg/dL   Protein  Creatinine Ratio 0.07 0.00 - 0.15 mg/mg[Cre]  CBC     Status: Abnormal   Collection Time: 04/28/16  1:23 PM  Result Value Ref Range   WBC 5.5 4.0 - 10.5 K/uL   RBC 4.56 3.87 - 5.11 MIL/uL   Hemoglobin 12.8 12.0 - 15.0 g/dL   HCT 38.8 36.0 - 46.0 %   MCV 85.1 78.0 - 100.0 fL   MCH 28.1 26.0 - 34.0 pg   MCHC 33.0 30.0 - 36.0 g/dL   RDW 23.1 (H) 11.5 - 15.5 %   Platelets 276 150 - 400 K/uL  Comprehensive metabolic panel     Status: Abnormal   Collection Time: 04/28/16  1:23 PM  Result Value Ref Range   Sodium 135 135 - 145 mmol/L   Potassium 3.9 3.5 - 5.1 mmol/L   Chloride 106 101 - 111 mmol/L   CO2 22 22 - 32 mmol/L   Glucose, Bld 92 65 - 99 mg/dL   BUN 14 6 - 20 mg/dL   Creatinine, Ser 0.58 0.44 - 1.00 mg/dL   Calcium 8.9 8.9 - 10.3 mg/dL   Total Protein 6.9 6.5 - 8.1 g/dL   Albumin 3.4 (L) 3.5 - 5.0 g/dL   AST 19 15 - 41 U/L   ALT 24 14 - 54 U/L   Alkaline Phosphatase 62 38 - 126 U/L   Total Bilirubin 0.2 (L) 0.3 - 1.2 mg/dL   GFR calc non Af Amer >60 >60 mL/min   GFR calc Af Amer >60 >60 mL/min   Anion gap 7 5 - 15  Lactate dehydrogenase     Status: Abnormal   Collection Time: 04/28/16  1:23 PM  Result Value Ref Range   LDH 230 (H) 98 - 192 U/L  Uric acid     Status: None   Collection Time: 04/28/16  1:23 PM  Result Value Ref Range   Uric Acid, Serum 5.0 2.3 - 6.6 mg/dL    No results found.  Assessment/Plan: Postpartum preeclampsia- Severe features by headache and BP. S/p SVD. Not Breast feeding.  Admit for Magnesium x 24 hours. Hydralazine prn for severe range BP. Strict I/Os. Percocet for headache.  Thurnell Lose 04/28/2016, 2:57 PM

## 2016-04-28 NOTE — Progress Notes (Signed)
NP at bedside discussing plan of care with pt. NP requests for pt to received Hydralazine 10mg  iv now.

## 2016-04-28 NOTE — MAU Provider Note (Signed)
History     CSN: [id number]  Arrival date and time: 04/28/16 1224   First Provider Initiated Contact with Patient 04/28/16 1332      No chief complaint on file.  HPI   Sherri Douglas is a 35 y.o. female [phone] here with elevated BP and HA. She is status post vaginal delivery on 9/19; past OB history SVD x 3 ( 2005, 2010, 2017).  The HA developed 2 days ago "this is not a normal HA" She has tried over the counter Ibuprofen without any relief. She called the Dr. Gabriel Carina this week with concerns about increased swelling in both of her feet. She is concerned about this.   She denies history of chronic HTN or gestational HTN.   OB History    Gravida Para Term Preterm AB Living   4 3 3  0 1 3   SAB TAB Ectopic Multiple Live Births   0 1 0 0 2      Past Medical History:  Diagnosis Date  . Abnormal Pap smear   . Anemia   . Chlamydia   . Gestational diabetes   . Trichomoniasis of vagina 2015  . Urinary tract infection     Past Surgical History:  Procedure Laterality Date  . INDUCED ABORTION    . WISDOM TOOTH EXTRACTION      Family History  Problem Relation Age of Onset  . Diabetes Father   . Hypertension Father   . Diabetes Paternal Aunt   . Hypertension Paternal Aunt   . Diabetes Paternal Grandmother   . Hypertension Paternal Grandmother   . Diabetes Paternal Grandfather   . Hypertension Paternal Grandfather   . Anesthesia problems Neg Hx     Social History  Substance Use Topics  . Smoking status: Former Research scientist (life sciences)  . Smokeless tobacco: Never Used  . Alcohol use Yes     Comment: socially    Allergies:  Allergies  Allergen Reactions  . Amoxicillin Rash    Has patient had a PCN reaction causing immediate rash, facial/tongue/throat swelling, SOB or lightheadedness with hypotension: No Has patient had a PCN reaction causing severe rash involving mucus membranes or skin necrosis: Yes Has patient had a PCN reaction that required hospitalization No Has patient had a  PCN reaction occurring within the last 10 years: No If all of the above answers are "NO", then may proceed with Cephalosporin use.     Prescriptions Prior to Admission  Medication Sig Dispense Refill Last Dose  . ibuprofen (ADVIL,MOTRIN) 600 MG tablet Take 1 tablet (600 mg total) by mouth every 6 (six) hours as needed. (Patient taking differently: Take 600 mg by mouth every 6 (six) hours as needed for moderate pain. ) 30 tablet 1 04/28/2016 at Unknown time  . oxyCODONE (OXY IR/ROXICODONE) 5 MG immediate release tablet Take 1 tablet (5 mg total) by mouth every 4 (four) hours as needed (pain scale 4-7). 12 tablet 0 Past Week at Unknown time  . Prenatal MV-Min-Fe Fum-FA-DHA (PRENATAL 1 PO) Take 1 tablet by mouth daily.    04/27/2016 at Unknown time   Results for orders placed or performed during the hospital encounter of 04/28/16 (from the past 24 hour(s))  Protein / creatinine ratio, urine     Status: None   Collection Time: 04/28/16  1:00 PM  Result Value Ref Range   Creatinine, Urine 142.00 mg/dL   Total Protein, Urine 10 mg/dL   Protein Creatinine Ratio 0.07 0.00 - 0.15 mg/mg[Cre]  CBC  Status: Abnormal   Collection Time: 04/28/16  1:23 PM  Result Value Ref Range   WBC 5.5 4.0 - 10.5 K/uL   RBC 4.56 3.87 - 5.11 MIL/uL   Hemoglobin 12.8 12.0 - 15.0 g/dL   HCT 38.8 36.0 - 46.0 %   MCV 85.1 78.0 - 100.0 fL   MCH 28.1 26.0 - 34.0 pg   MCHC 33.0 30.0 - 36.0 g/dL   RDW 23.1 (H) 11.5 - 15.5 %   Platelets 276 150 - 400 K/uL  Comprehensive metabolic panel     Status: Abnormal   Collection Time: 04/28/16  1:23 PM  Result Value Ref Range   Sodium 135 135 - 145 mmol/L   Potassium 3.9 3.5 - 5.1 mmol/L   Chloride 106 101 - 111 mmol/L   CO2 22 22 - 32 mmol/L   Glucose, Bld 92 65 - 99 mg/dL   BUN 14 6 - 20 mg/dL   Creatinine, Ser 0.58 0.44 - 1.00 mg/dL   Calcium 8.9 8.9 - 10.3 mg/dL   Total Protein 6.9 6.5 - 8.1 g/dL   Albumin 3.4 (L) 3.5 - 5.0 g/dL   AST 19 15 - 41 U/L   ALT 24 14 - 54  U/L   Alkaline Phosphatase 62 38 - 126 U/L   Total Bilirubin 0.2 (L) 0.3 - 1.2 mg/dL   GFR calc non Af Amer >60 >60 mL/min   GFR calc Af Amer >60 >60 mL/min   Anion gap 7 5 - 15  Lactate dehydrogenase     Status: Abnormal   Collection Time: 04/28/16  1:23 PM  Result Value Ref Range   LDH 230 (H) 98 - 192 U/L  Uric acid     Status: None   Collection Time: 04/28/16  1:23 PM  Result Value Ref Range   Uric Acid, Serum 5.0 2.3 - 6.6 mg/dL   Review of Systems  Eyes: Negative for blurred vision, double vision and photophobia.  Respiratory: Negative for shortness of breath.   Cardiovascular: Negative for chest pain.  Gastrointestinal: Negative for abdominal pain.  Neurological: Positive for headaches.   Physical Exam   Blood pressure 175/94, pulse 64, temperature 98.4 F (36.9 C), temperature source Oral, resp. rate 17, height 4\' 11"  (1.499 m), weight 160 lb (72.6 kg), not currently breastfeeding.   Patient Vitals for the past 24 hrs:  BP Temp Temp src Pulse Resp Height Weight  04/28/16 1418 (!) 183/117 - - 62 - - -  04/28/16 1335 (!) 164/107 - - 100 - - -  04/28/16 1334 175/94 - - 64 - - -  04/28/16 1322 - - - - - 4\' 11"  (1.499 m) 160 lb (72.6 kg)  04/28/16 1315 (!) 158/106 - - 63 17 - -  04/28/16 1258 135/93 98.4 F (36.9 C) Oral 77 18 - -    Physical Exam  Constitutional: She is oriented to person, place, and time. She appears well-developed and well-nourished. No distress.  HENT:  Head: Normocephalic.  Musculoskeletal: Normal range of motion.  Neurological: She is alert and oriented to person, place, and time. She has normal reflexes. She displays normal reflexes. Coordination normal.  Negative clonus   Skin: She is not diaphoretic.    MAU Course  Procedures  None  MDM  PIH labs Discussed BP readings with Dr. Simona Huh Labetalol 20 mg IV Hydralazine 10 mg IV Labetalol 40 mg IV  Assessment and Plan   A:  1. Preeclampsia in postpartum period  P:  Admit  to the 3rd floor per Dr. Dickie La I Rasch, NP 04/28/2016 2:31 PM

## 2016-04-29 LAB — CBC
HEMATOCRIT: 40.7 % (ref 36.0–46.0)
HEMOGLOBIN: 13.5 g/dL (ref 12.0–15.0)
MCH: 28.4 pg (ref 26.0–34.0)
MCHC: 33.2 g/dL (ref 30.0–36.0)
MCV: 85.7 fL (ref 78.0–100.0)
Platelets: 281 10*3/uL (ref 150–400)
RBC: 4.75 MIL/uL (ref 3.87–5.11)
RDW: 23.3 % — AB (ref 11.5–15.5)
WBC: 6.5 10*3/uL (ref 4.0–10.5)

## 2016-04-29 LAB — COMPREHENSIVE METABOLIC PANEL
ALBUMIN: 3.5 g/dL (ref 3.5–5.0)
ALK PHOS: 61 U/L (ref 38–126)
ALT: 19 U/L (ref 14–54)
ANION GAP: 8 (ref 5–15)
AST: 17 U/L (ref 15–41)
BILIRUBIN TOTAL: 0.1 mg/dL — AB (ref 0.3–1.2)
BUN: 9 mg/dL (ref 6–20)
CALCIUM: 7.1 mg/dL — AB (ref 8.9–10.3)
CO2: 26 mmol/L (ref 22–32)
Chloride: 104 mmol/L (ref 101–111)
Creatinine, Ser: 0.62 mg/dL (ref 0.44–1.00)
GFR calc Af Amer: >60 mL/min (ref >60)
GFR calc non Af Amer: >60 mL/min (ref >60)
GLUCOSE: 103 mg/dL — AB (ref 65–99)
Potassium: 3.4 mmol/L — ABNORMAL LOW (ref 3.5–5.1)
Sodium: 138 mmol/L (ref 135–145)
TOTAL PROTEIN: 6.5 g/dL (ref 6.5–8.1)

## 2016-04-29 MED ORDER — NIFEDIPINE ER OSMOTIC RELEASE 30 MG PO TB24
30.0000 mg | ORAL_TABLET | Freq: Every day | ORAL | Status: DC
  Administered 2016-04-29: 30 mg via ORAL
  Filled 2016-04-29: qty 1

## 2016-04-29 NOTE — Progress Notes (Signed)
Postpartum day #9, NSVD.  Readmitted for Postpartum preeclampsia and headache.   S:  Overnight she still had a headache that improved with percocet.  Currently she denies having a headache.  No blurry vision or RUQ pain.  No nausea/vomiting/CP/SOB.  Tolerating gen diet.  Ambulating and voiding without issues.  No acute complaints this am.  Temp:  [98.2 F (36.8 C)-98.4 F (36.9 C)] 98.2 F (36.8 C) (09/28 0400) Pulse Rate:  [60-100] 64 (09/28 0600) Resp:  [14-20] 14 (09/28 0600) BP: (118-183)/(73-117) 149/87 (09/28 0600) SpO2:  [97 %-100 %] 97 % (09/28 0600) Weight:  [72.6 kg (160 lb)] 72.6 kg (160 lb) (09/27 1322)    Gen: NAD CV: RRR Lungs: CTAB Abd: soft, non-tender, no rebound, no guarding Uterus: firm, non-tender, below umbilicus Ext: minimal pedal edema, no calf tenderness, no clonus, DTRs 2+  Results for orders placed or performed during the hospital encounter of 04/28/16 (from the past 24 hour(s))  Protein / creatinine ratio, urine     Status: None   Collection Time: 04/28/16  1:00 PM  Result Value Ref Range   Creatinine, Urine 142.00 mg/dL   Total Protein, Urine 10 mg/dL   Protein Creatinine Ratio 0.07 0.00 - 0.15 mg/mg[Cre]  CBC     Status: Abnormal   Collection Time: 04/28/16  1:23 PM  Result Value Ref Range   WBC 5.5 4.0 - 10.5 K/uL   RBC 4.56 3.87 - 5.11 MIL/uL   Hemoglobin 12.8 12.0 - 15.0 g/dL   HCT 38.8 36.0 - 46.0 %   MCV 85.1 78.0 - 100.0 fL   MCH 28.1 26.0 - 34.0 pg   MCHC 33.0 30.0 - 36.0 g/dL   RDW 23.1 (H) 11.5 - 15.5 %   Platelets 276 150 - 400 K/uL  Comprehensive metabolic panel     Status: Abnormal   Collection Time: 04/28/16  1:23 PM  Result Value Ref Range   Sodium 135 135 - 145 mmol/L   Potassium 3.9 3.5 - 5.1 mmol/L   Chloride 106 101 - 111 mmol/L   CO2 22 22 - 32 mmol/L   Glucose, Bld 92 65 - 99 mg/dL   BUN 14 6 - 20 mg/dL   Creatinine, Ser 0.58 0.44 - 1.00 mg/dL   Calcium 8.9 8.9 - 10.3 mg/dL   Total Protein 6.9 6.5 - 8.1 g/dL   Albumin 3.4 (L) 3.5 - 5.0 g/dL   AST 19 15 - 41 U/L   ALT 24 14 - 54 U/L   Alkaline Phosphatase 62 38 - 126 U/L   Total Bilirubin 0.2 (L) 0.3 - 1.2 mg/dL   GFR calc non Af Amer >60 >60 mL/min   GFR calc Af Amer >60 >60 mL/min   Anion gap 7 5 - 15  Lactate dehydrogenase     Status: Abnormal   Collection Time: 04/28/16  1:23 PM  Result Value Ref Range   LDH 230 (H) 98 - 192 U/L  Uric acid     Status: None   Collection Time: 04/28/16  1:23 PM  Result Value Ref Range   Uric Acid, Serum 5.0 2.3 - 6.6 mg/dL  CBC     Status: Abnormal   Collection Time: 04/29/16  5:14 AM  Result Value Ref Range   WBC 6.5 4.0 - 10.5 K/uL   RBC 4.75 3.87 - 5.11 MIL/uL   Hemoglobin 13.5 12.0 - 15.0 g/dL   HCT 40.7 36.0 - 46.0 %   MCV 85.7 78.0 - 100.0 fL  MCH 28.4 26.0 - 34.0 pg   MCHC 33.2 30.0 - 36.0 g/dL   RDW 23.3 (H) 11.5 - 15.5 %   Platelets 281 150 - 400 K/uL     A/P:35yo [id number] readmitted for postpartum preeclampsia  -Currently on IV Magnesium.  Due to headache, if BP stable will consider stopping a few hours early to monitor symptoms and BP -no IV or oral BP medications currently, will likely start on oral Procardia XL 30mg  this am - adequate diuresis, no further Lasix at this time -continue oral pain medicine as needed for her headache - continue routine postpartum care  DISPO: Pending blood pressure and headache today, will consider late discharge home today or possibly tomorrow.  Janyth Pupa, M 04/29/2016, 6:55 AM

## 2016-04-30 MED ORDER — OXYCODONE-ACETAMINOPHEN 5-325 MG PO TABS: 1.0000 | ORAL_TABLET | ORAL | 0 refills | Status: DC | PRN

## 2016-04-30 MED ORDER — NIFEDIPINE ER 30 MG PO TB24: 30.0000 mg | ORAL_TABLET | Freq: Every day | ORAL | 2 refills | Status: DC

## 2016-04-30 NOTE — Progress Notes (Signed)
Pt given education instructions, questions answered, pt states understanding, signed and given copy

## 2016-04-30 NOTE — Discharge Instructions (Signed)

## 2016-05-31 NOTE — Discharge Summary (Signed)
OB Discharge Summary     Patient Name: Sherri Douglas DOB: 05-07-1981 MRN: [id number]  Date of admission: 04/28/2016 Delivering MD: This patient has no babies on file.  Date of discharge: 05/31/2016  Admitting diagnosis: PP 7 DAYS HBP Intrauterine pregnancy: Unknown     Secondary diagnosis:  Active Problems:   Preeclampsia in postpartum period With severe features by headache and BP  Additional problems:      Discharge diagnosis: Preeclampsia (severe)                                                                                                Hospital course:  Pt admitted for magnesium sulfate x 24 hours without complication.  Given IV Labetalol x 2+.  Started on Procardia XL 30 mg.  BP was controlled at discharge.  Physical exam  Vitals:   04/30/16 0248 04/30/16 0404 04/30/16 0501 04/30/16 0600  BP:  121/72 (!) 144/90 134/75  Pulse:  79 80 68  Resp: 18 18 18 18   Temp:  98.3 F (36.8 C)    TempSrc:  Oral    SpO2:      Weight:      Height:       General: alert, cooperative and no distress Abd: Soft, No RUQ pain. DVT Evaluation: No evidence of DVT seen on physical exam. Calf/Ankle edema is present Labs: Lab Results  Component Value Date   WBC 6.5 04/29/2016   HGB 13.5 04/29/2016   HCT 40.7 04/29/2016   MCV 85.7 04/29/2016   PLT 281 04/29/2016   CMP Latest Ref Rng & Units 04/29/2016  Glucose 65 - 99 mg/dL 103(H)  BUN 6 - 20 mg/dL 9  Creatinine 0.44 - 1.00 mg/dL 0.62  Sodium 135 - 145 mmol/L 138  Potassium 3.5 - 5.1 mmol/L 3.4(L)  Chloride 101 - 111 mmol/L 104  CO2 22 - 32 mmol/L 26  Calcium 8.9 - 10.3 mg/dL 7.1(L)  Total Protein 6.5 - 8.1 g/dL 6.5  Total Bilirubin 0.3 - 1.2 mg/dL 0.1(L)  Alkaline Phos 38 - 126 U/L 61  AST 15 - 41 U/L 17  ALT 14 - 54 U/L 19    Discharge instruction: per After Visit Summary and "Baby and Me Booklet".  After visit meds:    Medication List    STOP taking these medications   oxyCODONE 5 MG immediate release  tablet Commonly known as:  Oxy IR/ROXICODONE     TAKE these medications   ibuprofen 600 MG tablet Commonly known as:  ADVIL,MOTRIN Take 1 tablet (600 mg total) by mouth every 6 (six) hours as needed. What changed:  reasons to take this   NIFEdipine 30 MG 24 hr tablet Commonly known as:  PROCARDIA-XL/ADALAT CC Take 1 tablet (30 mg total) by mouth daily.   oxyCODONE-acetaminophen 5-325 MG tablet Commonly known as:  PERCOCET/ROXICET Take 1-2 tablets by mouth every 4 (four) hours as needed for moderate pain or severe pain.   PRENATAL 1 PO Take 1 tablet by mouth daily.       Diet: low salt diet  Activity: Advance as tolerated. Pelvic rest for  6 weeks.   Outpatient follow up:1 week w/ Dr. Landry Mellow.   05/31/2016 Thurnell Lose, MD

## 2016-08-29 IMAGING — DX DG CHEST 2V
2 series · 2 of 2 positions shown · non-contrast
Comparison: None in PACs

CLINICAL DATA: Three days of bilateral chest pain with increased
symptoms on the right when patient is supine, current smoker.

EXAM:
CHEST  2 VIEW

[chest pa]
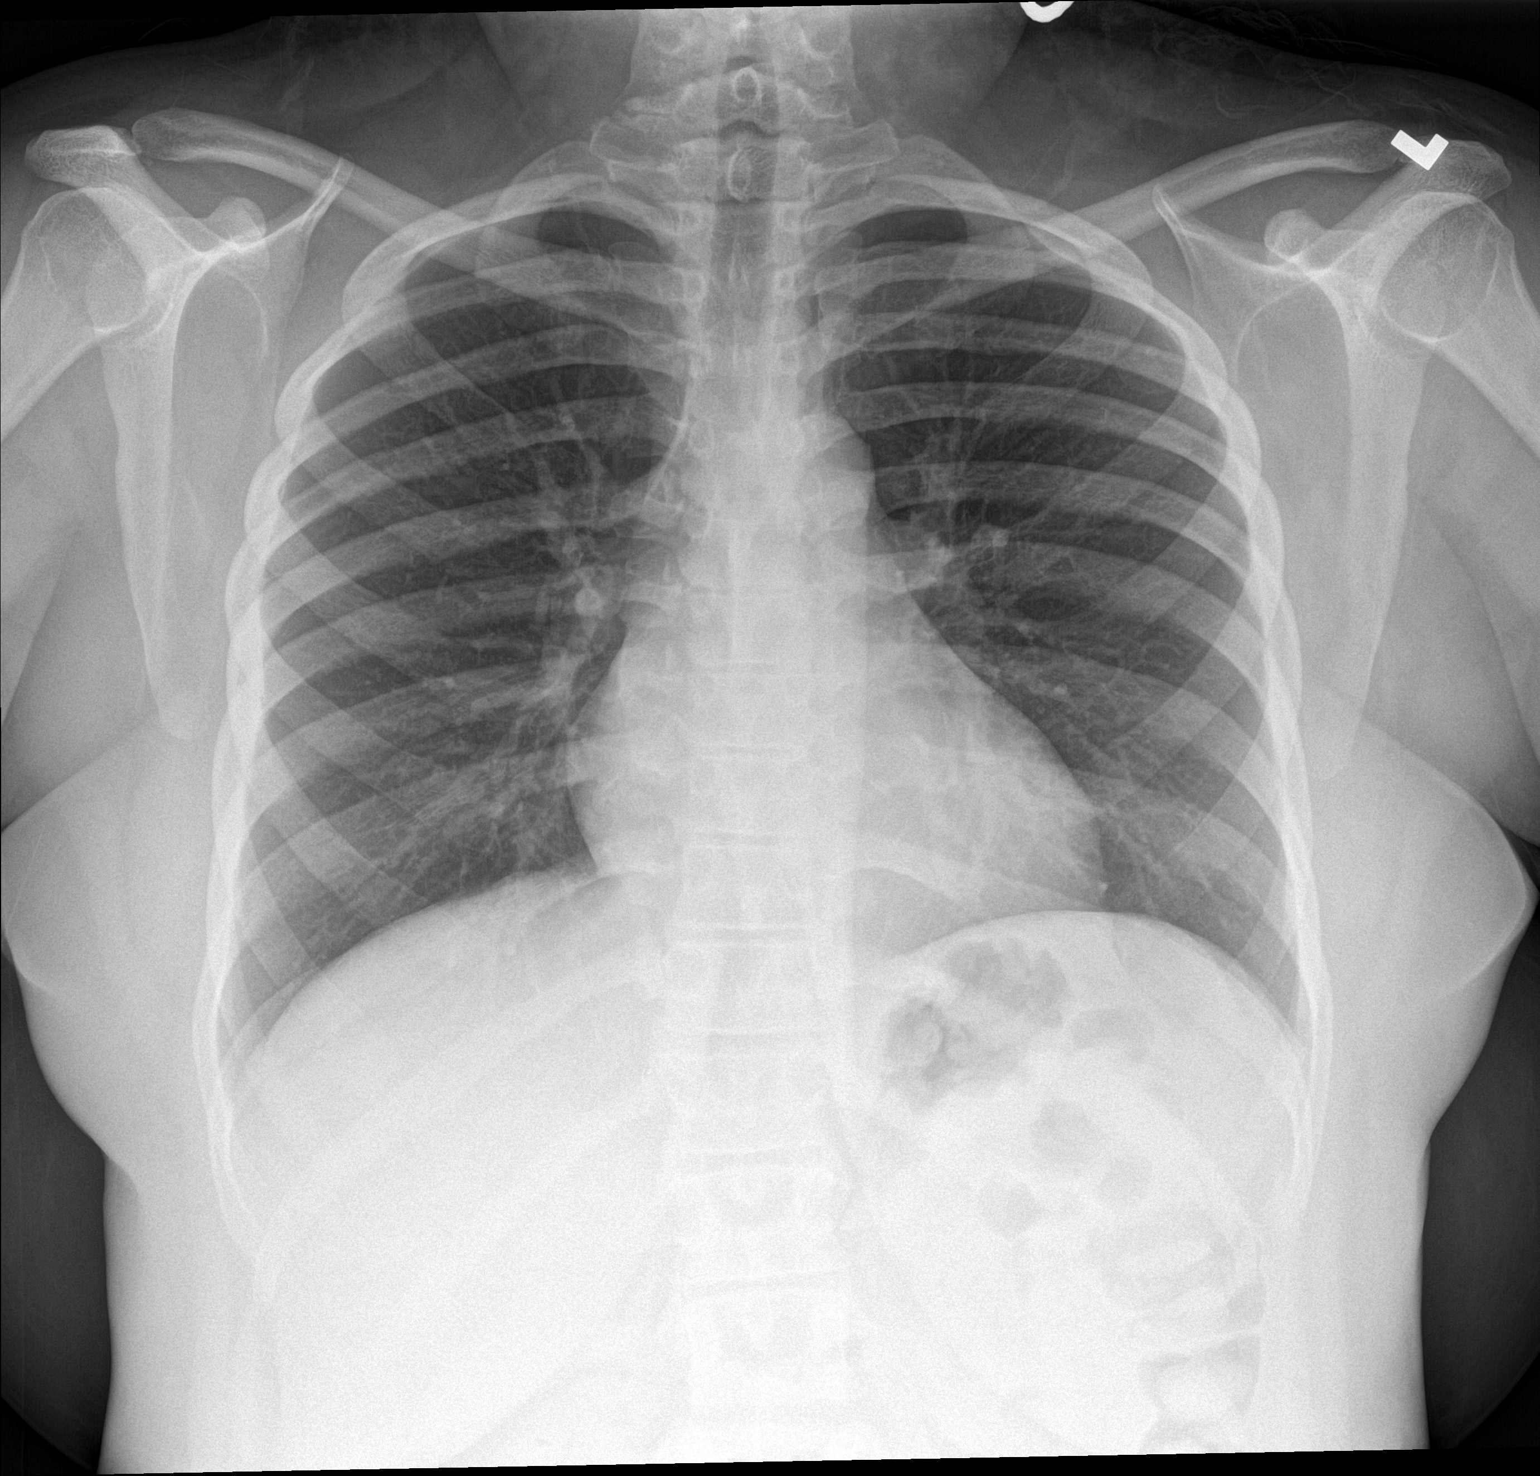

[chest lat]
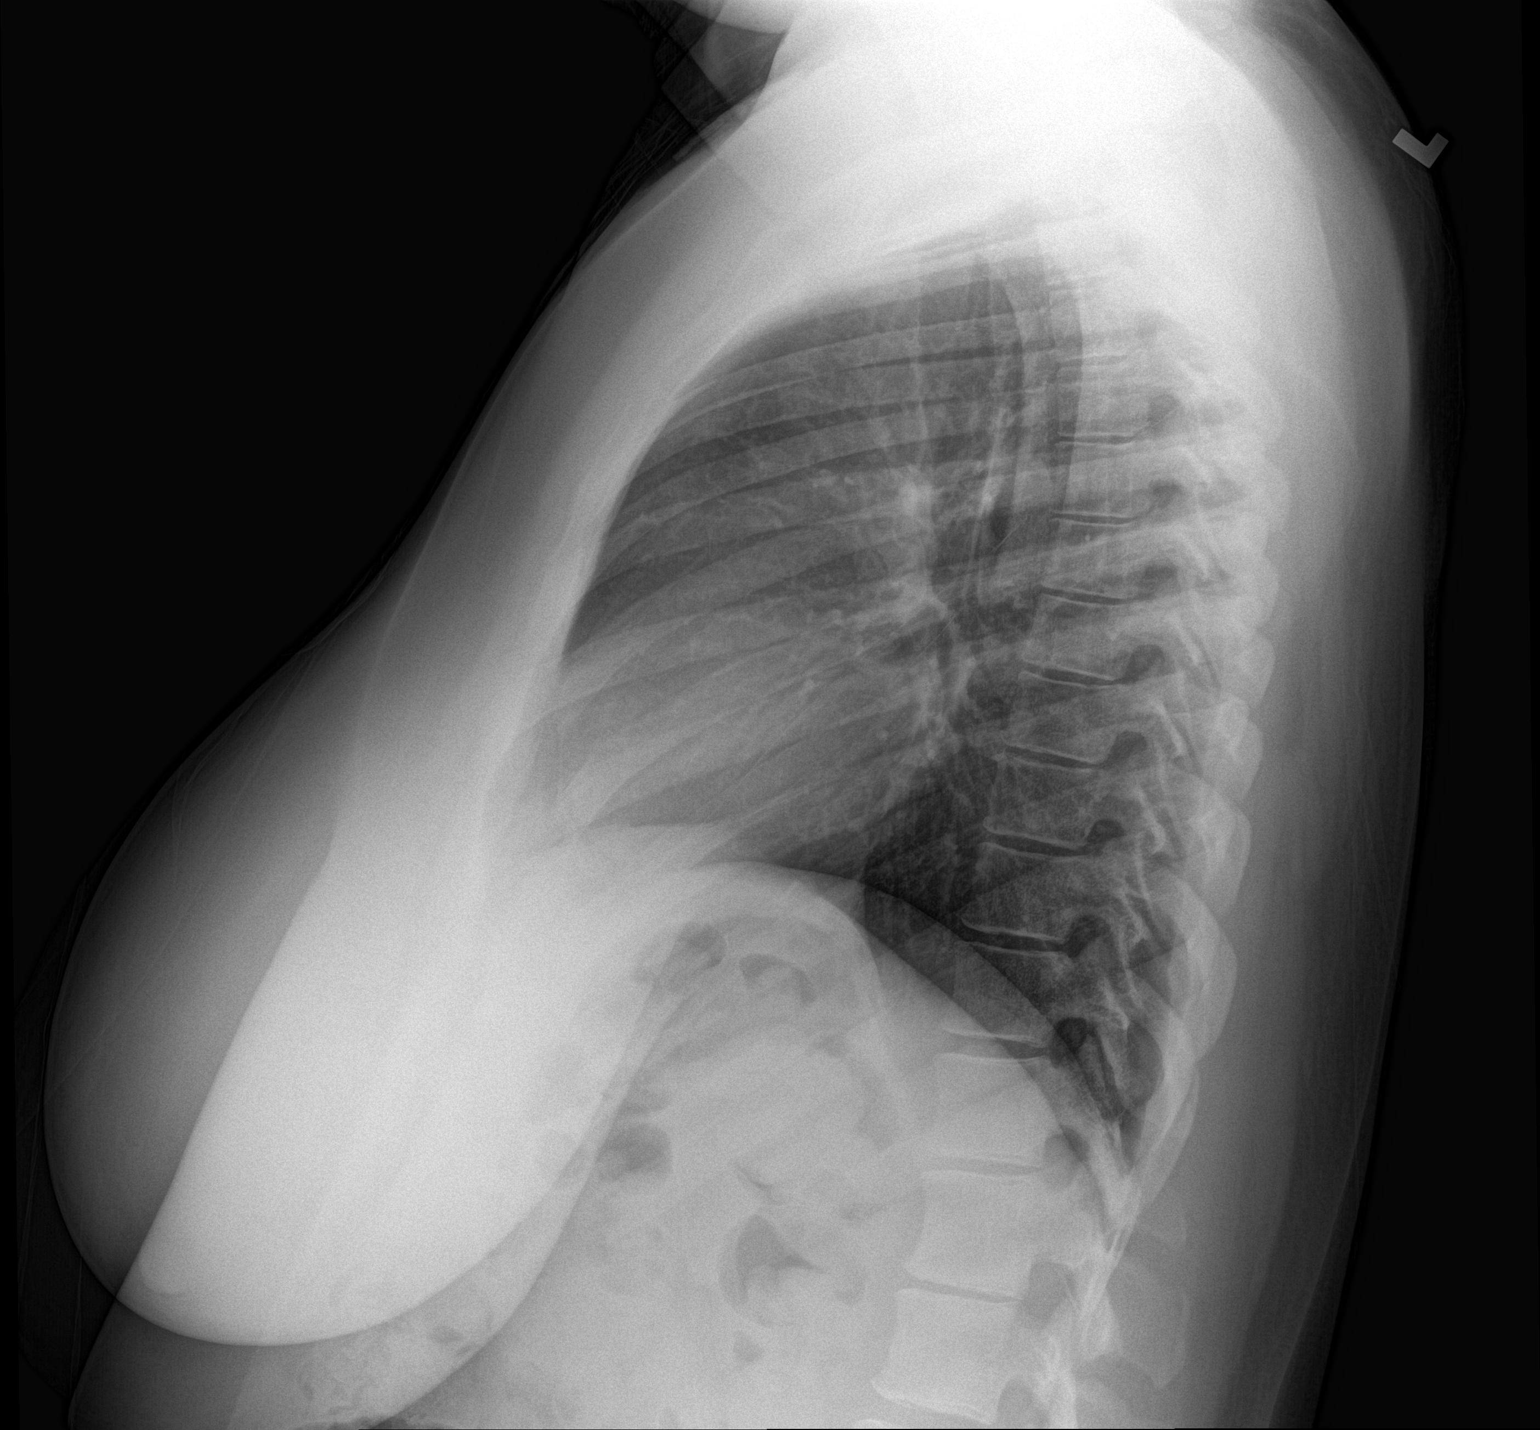

[2 of 2 positions shown; findings below may reference images not displayed]

FINDINGS: The lungs are well-expanded and clear. The heart and mediastinal
structures are normal. There is no pleural effusion or pneumothorax.
The bony thorax is unremarkable.
IMPRESSION: There is no active cardiopulmonary disease.

## 2017-01-27 IMAGING — US US OB TRANSVAGINAL
1 series · 15 of 28 positions shown · non-contrast
Comparison: None.

CLINICAL DATA: First trimester bleeding.  Nausea and vomiting.

EXAM:
OBSTETRIC <14 WK US AND TRANSVAGINAL OB US
TECHNIQUE: Both transabdominal and transvaginal ultrasound examinations were
performed for complete evaluation of the gestation as well as the
maternal uterus, adnexal regions, and pelvic cul-de-sac.
Transvaginal technique was performed to assess early pregnancy.

[Series 1: us ob transvaginal · 15 of 38 slices shown]
[im 1/38]
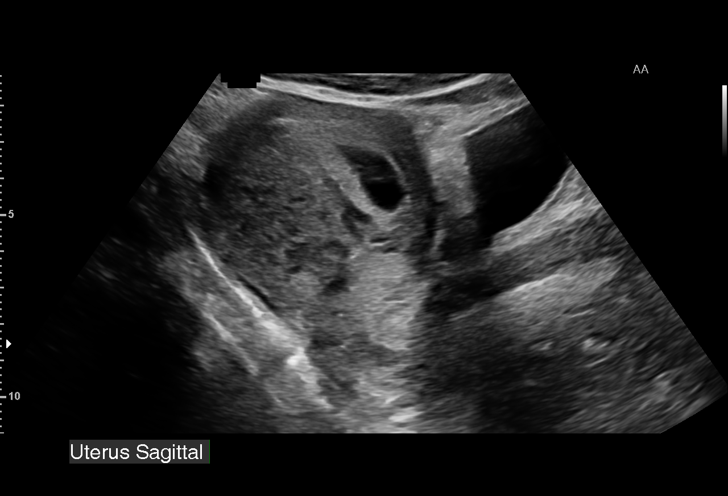
[im 3/38]
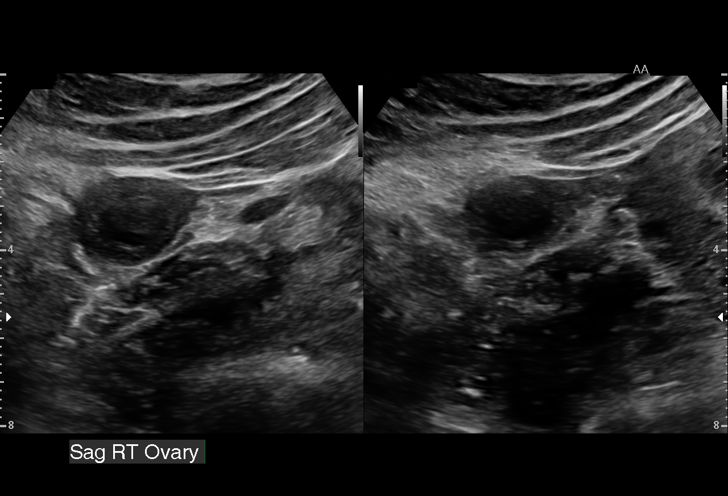
[im 6/38]
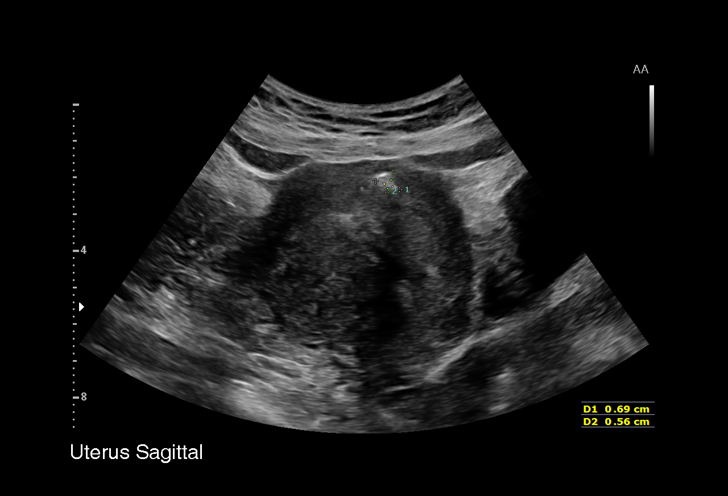
[im 9/38]
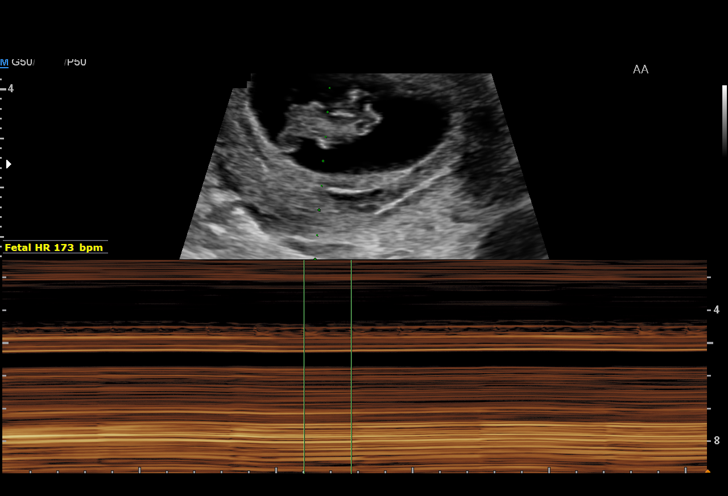
[im 11/38]
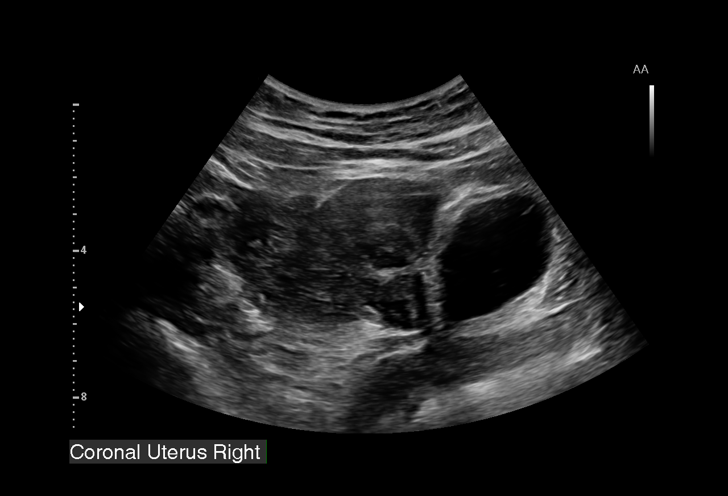
[im 14/38]
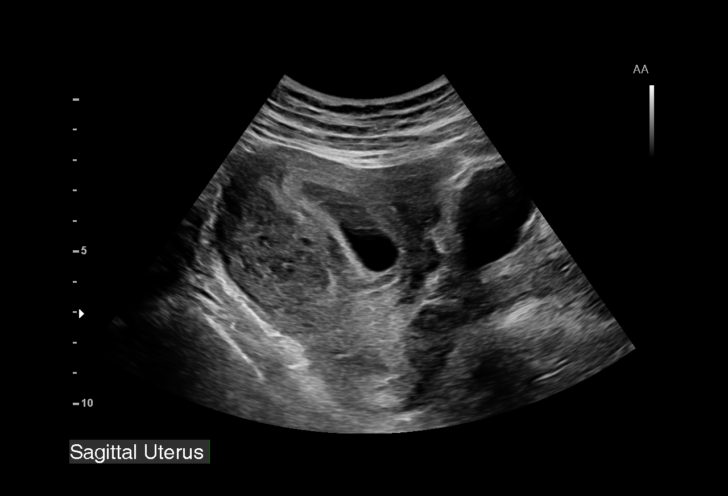
[im 17/38]
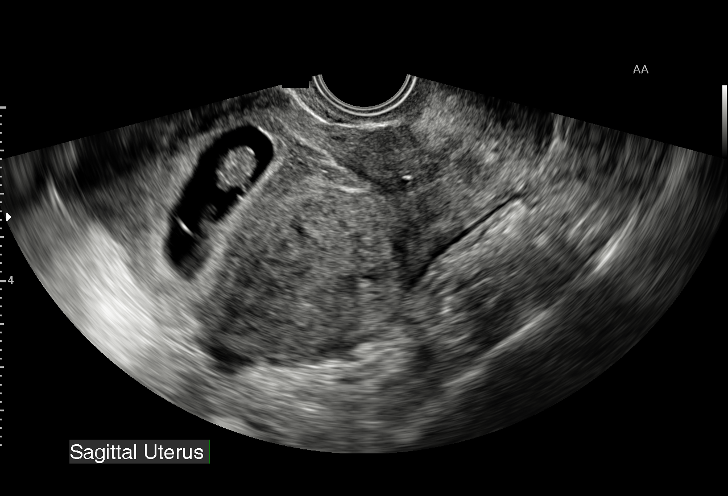
[im 20/38]
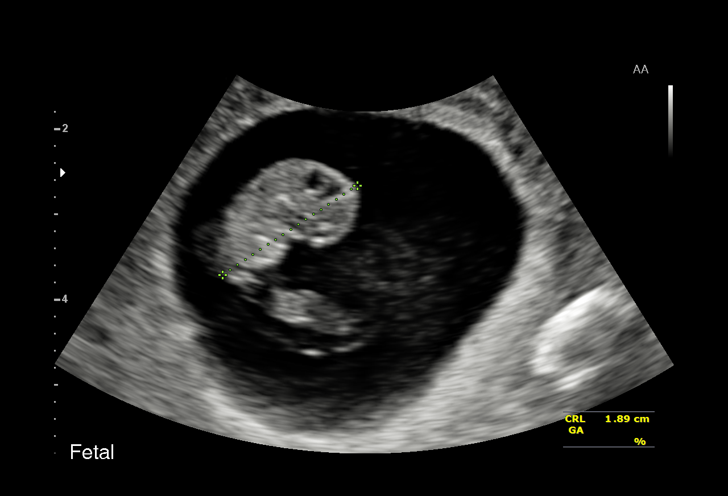
[im 21/38]
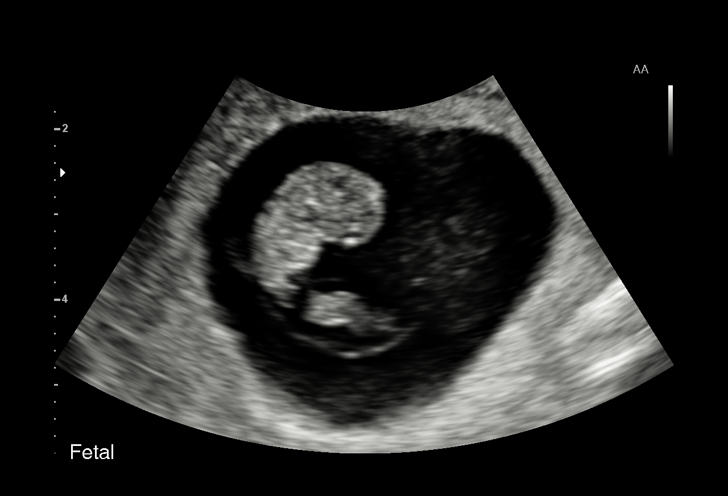
[im 24/38]
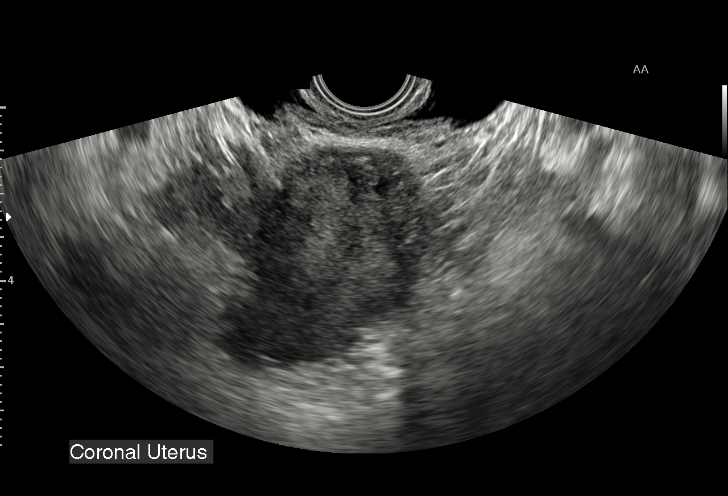
[im 27/38]
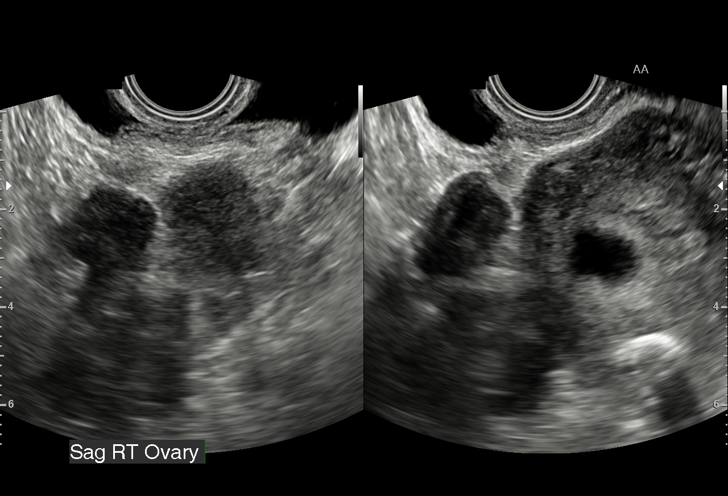
[im 29/38]
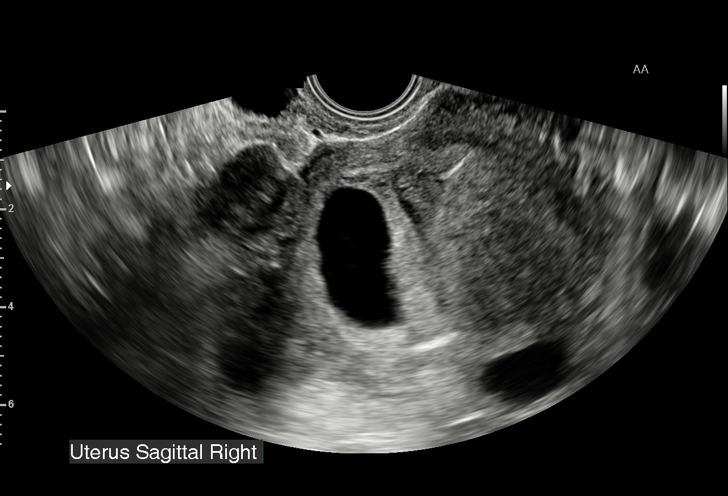
[im 32/38]
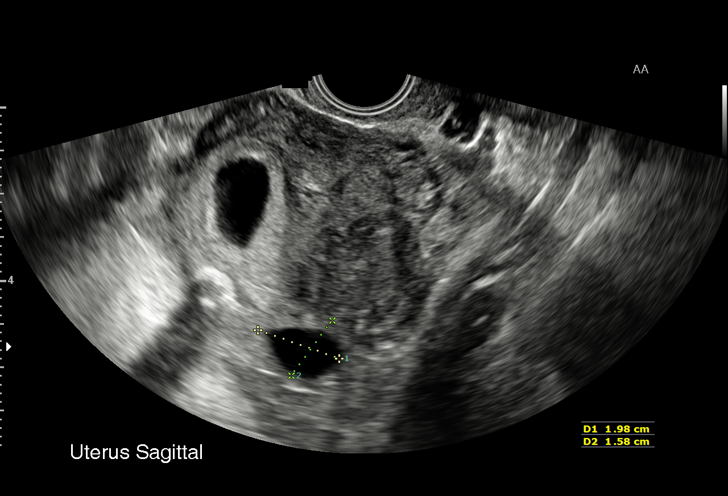
[im 35/38]
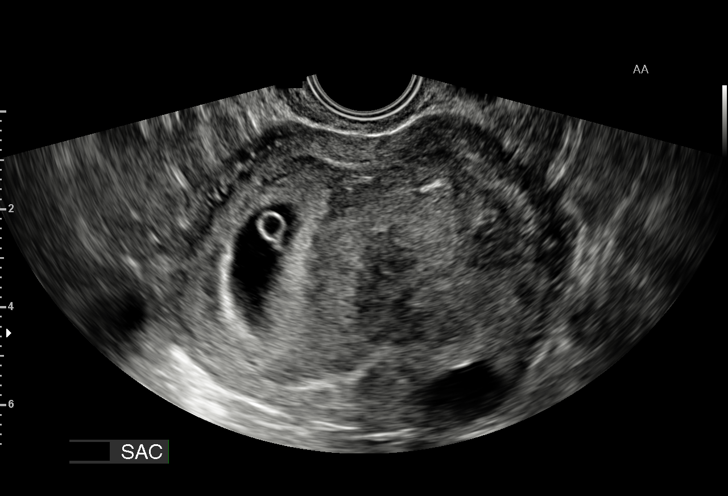
[im 38/38]
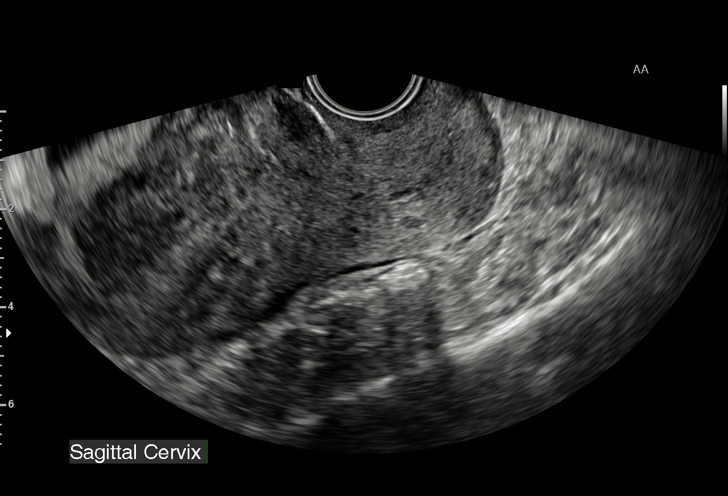

[15 of 28 positions shown; findings below may reference images not displayed]

FINDINGS: Intrauterine gestational sac: Visualized/normal in shape.

Yolk sac:  Seen and normal in appearance.

Embryo:  Seen and normal in appearance.

Cardiac Activity: A fetal heart rate of 167 beats per minute is
identified.

Heart Rate: 167  bpm

CRL: 1.88 cm mm 8 w 3 d US EDC: April 16, 2016

Subchorionic hemorrhage:  None visualized.

Maternal uterus/adnexae: Multiple fibroids are seen in the uterus.
The largest fibroid on the right measures 2.6 x 2.4 x 3.2 cm. The
largest fibroid on the left measures 2 x 1.6 x 2.5 cm. The ovaries
are normal in appearance.
IMPRESSION: Live IUP.  No abnormalities.

## 2017-07-18 IMAGING — US US MFM FETAL BPP W/O NON-STRESS
1 series · 13 of 16 positions shown · non-contrast
Comparison: none

[Series 1: us mfm fetal bpp w/o non-stress · 16 acquisitions, 13 frames shown]
[im 1/16]
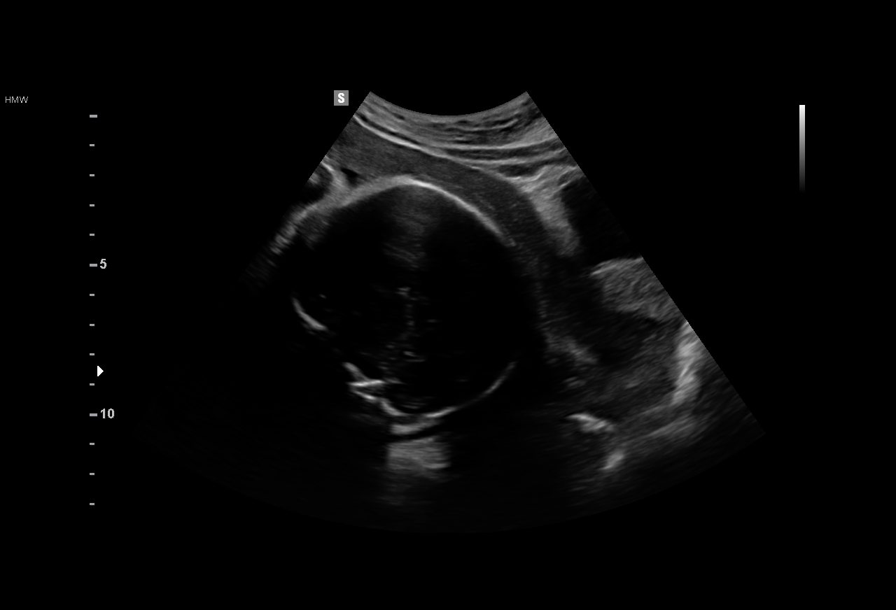
[im 2/16]
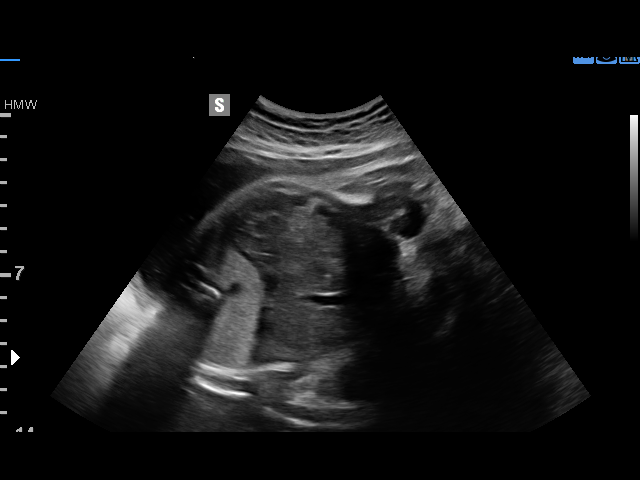
[im 4/16]
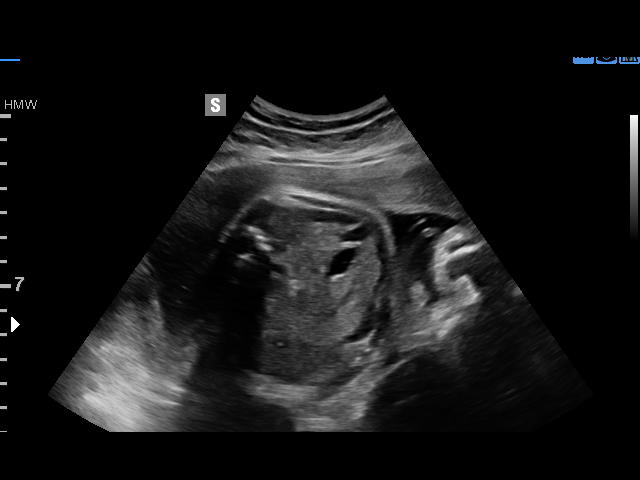
[im 5/16]
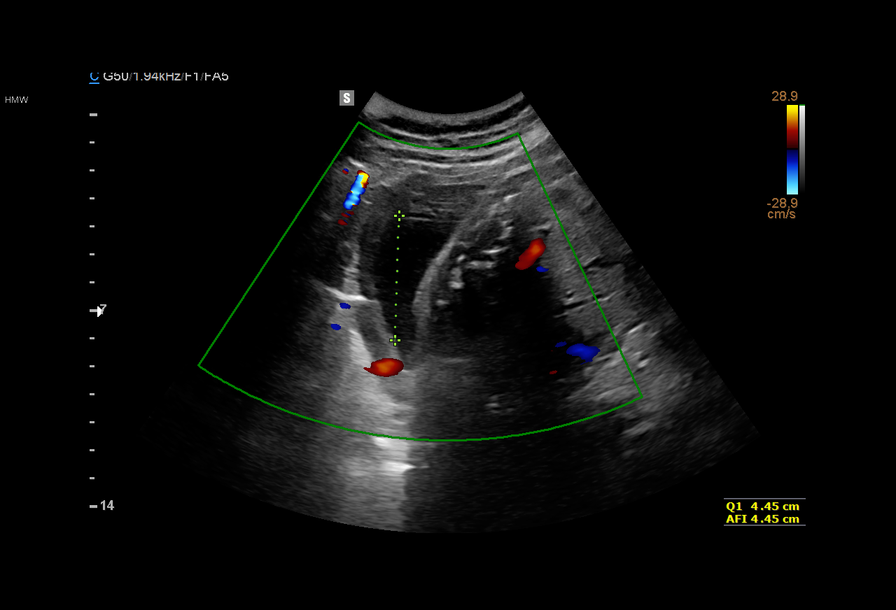
[im 6/16]
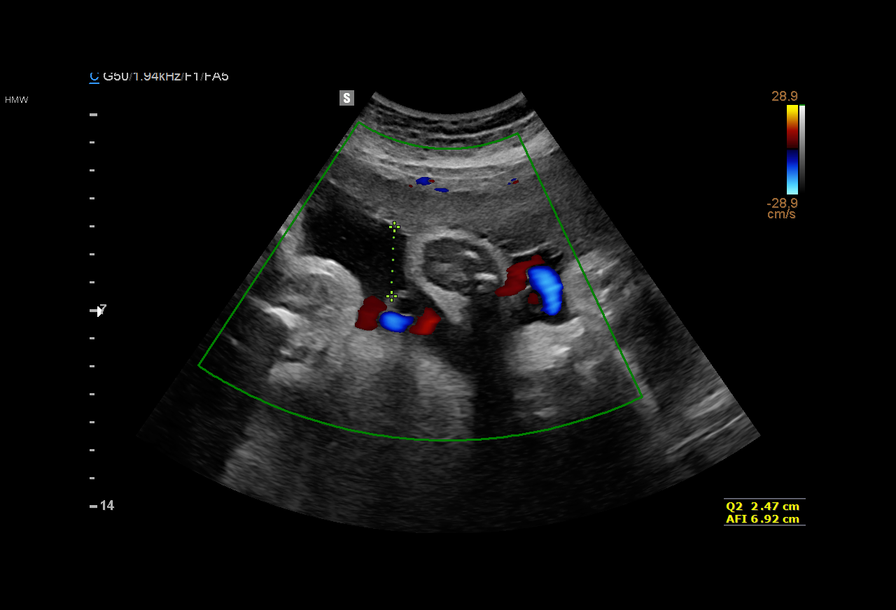
[im 7/16]
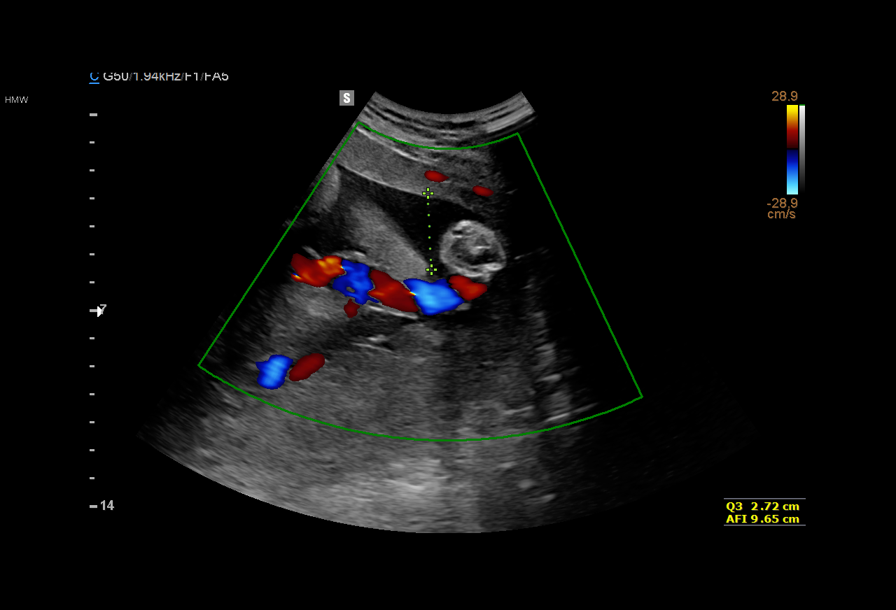
[im 9/16]
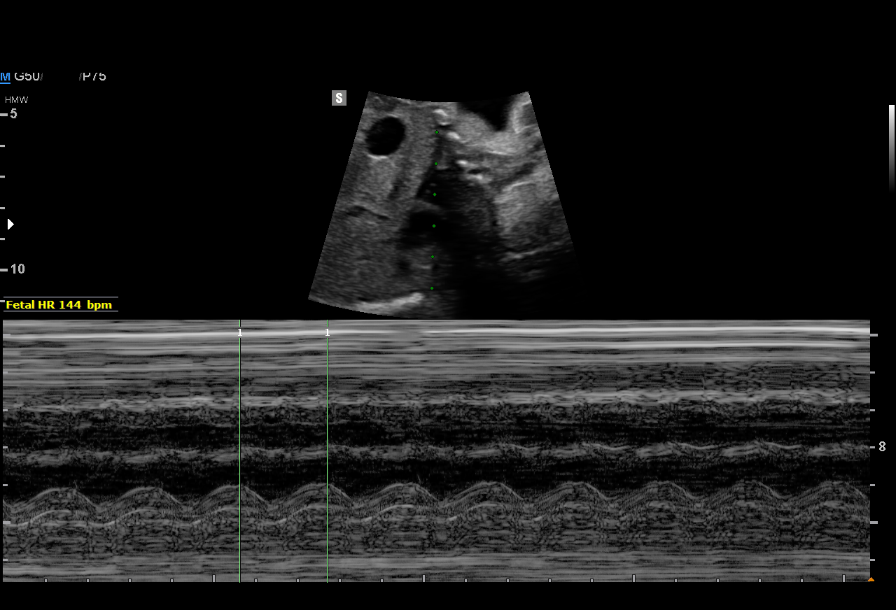
[im 10/16]
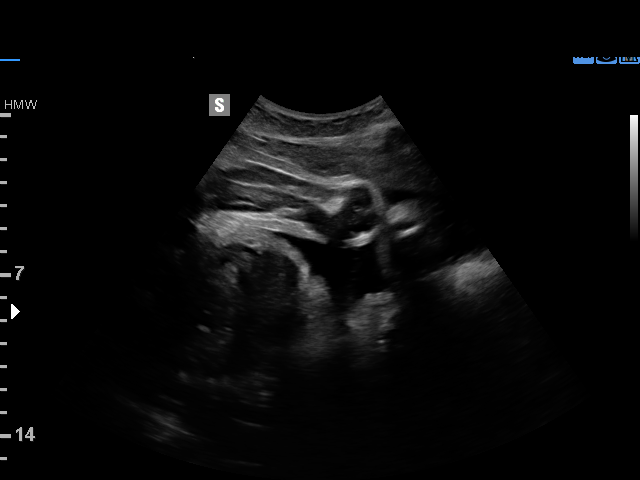
[im 11/16]
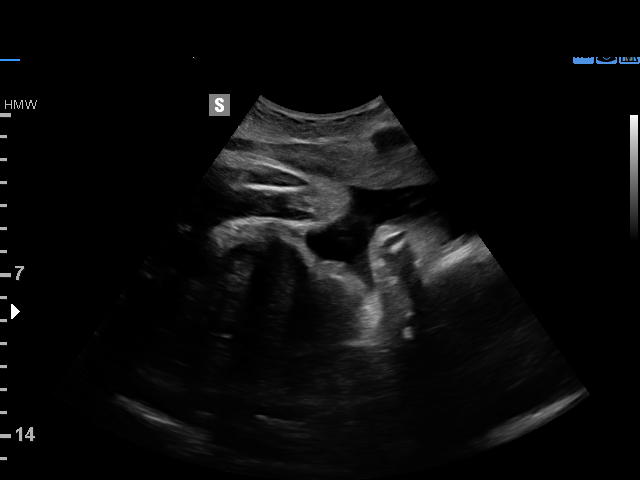
[im 12/16]
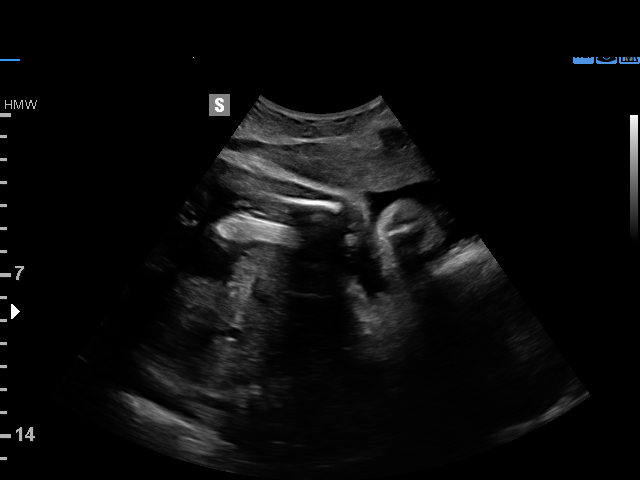
[im 13/16]
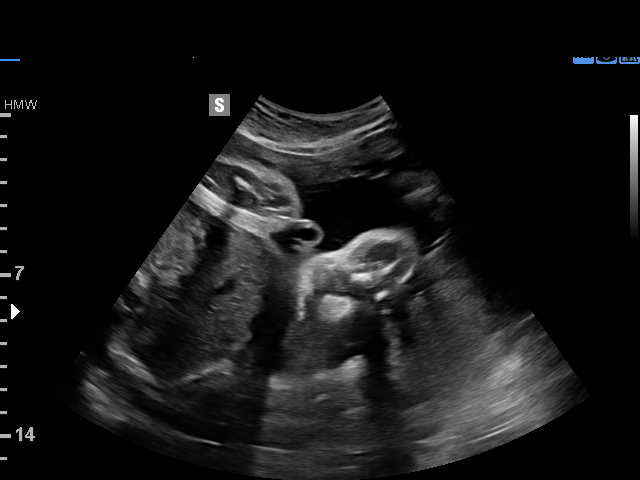
[im 15/16]
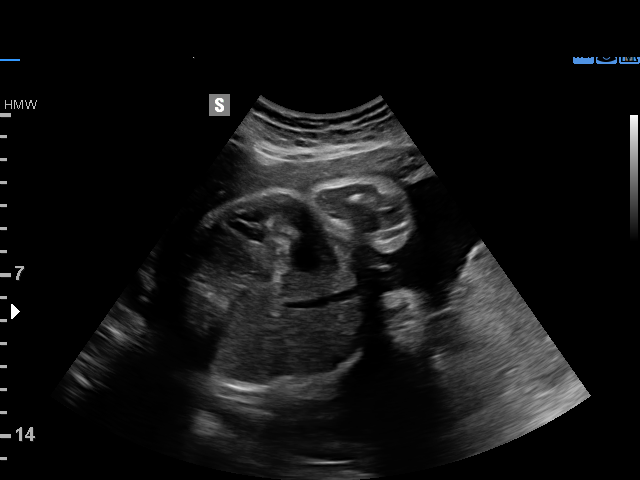
[im 16/16]
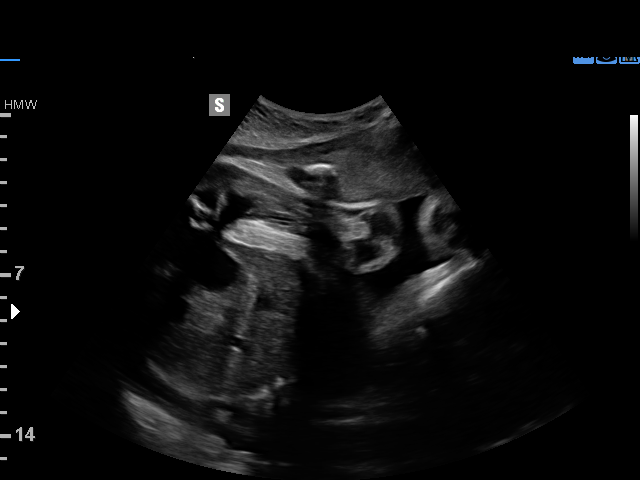

[13 of 16 positions shown; findings below may reference images not displayed]

Indications

33 weeks gestation of pregnancy
Non-reactive NST, FHR decelerations
Advanced maternal age multigravida 35+,
third trimester

OB History

Gravidity:    4         Term:   2         SAB:   1
Living:       2
Fetal Evaluation

Num Of Fetuses:     1
Fetal Heart         144
Rate(bpm):
Cardiac Activity:   Observed
Presentation:       Cephalic

Amniotic Fluid
AFI FV:      Subjectively within normal limits

AFI Sum(cm)     %Tile       Largest Pocket(cm)
13.31           43

RUQ(cm)       RLQ(cm)       LUQ(cm)        LLQ(cm)
4.45
Biophysical Evaluation

Amniotic F.V:   Within normal limits       F. Tone:        Observed
F. Movement:    Observed                   Score:          [DATE]
F. Breathing:   Observed
Gestational Age

LMP:           33w 4d       Date:   07/07/15                 EDD:   04/12/16
Best:          33w 4d    Det. By:   LMP  (07/07/15)          EDD:   04/12/16
Cervix Uterus Adnexa

Cervix
Not visualized (advanced GA >14wks)
Impression

Single IUP at 33w 4d
Cephalic presentation
BPP [DATE]
Normal amniotic fluid volume
Recommendations

Follow-up ultrasounds as clinically indicated.

## 2019-04-12 ENCOUNTER — Other Ambulatory Visit: Payer: Self-pay | Admitting: Student

## 2019-04-12 ENCOUNTER — Ambulatory Visit
Admission: RE | Admit: 2019-04-12 | Discharge: 2019-04-12 | Disposition: A | Discharge status: Home / Self Care | Payer: BC Managed Care – PPO | Source: Ambulatory Visit | Attending: Student | Admitting: Student

## 2019-04-12 DIAGNOSIS — L089 Local infection of the skin and subcutaneous tissue, unspecified: Secondary | ICD-10-CM

## 2019-04-20 ENCOUNTER — Other Ambulatory Visit: Payer: Self-pay | Admitting: Hematology and Oncology

## 2019-04-20 ENCOUNTER — Telehealth: Payer: Self-pay | Admitting: Hematology and Oncology

## 2019-04-20 DIAGNOSIS — D509 Iron deficiency anemia, unspecified: Secondary | ICD-10-CM

## 2019-04-20 NOTE — Telephone Encounter (Signed)
Scheduled appt per 9/18 sch message - pt aware of appt date and time   

## 2019-04-25 ENCOUNTER — Other Ambulatory Visit: Payer: Self-pay | Admitting: Hematology and Oncology

## 2019-04-25 DIAGNOSIS — D509 Iron deficiency anemia, unspecified: Secondary | ICD-10-CM

## 2019-04-26 ENCOUNTER — Inpatient Hospital Stay (HOSPITAL_BASED_OUTPATIENT_CLINIC_OR_DEPARTMENT_OTHER): Payer: BC Managed Care – PPO | Admitting: Hematology and Oncology

## 2019-04-26 ENCOUNTER — Other Ambulatory Visit: Payer: Self-pay

## 2019-04-26 ENCOUNTER — Inpatient Hospital Stay: Payer: BC Managed Care – PPO

## 2019-04-26 ENCOUNTER — Encounter: Payer: Self-pay | Admitting: Hematology and Oncology

## 2019-04-26 ENCOUNTER — Other Ambulatory Visit: Payer: Self-pay | Admitting: Hematology and Oncology

## 2019-04-26 ENCOUNTER — Inpatient Hospital Stay: Payer: BC Managed Care – PPO | Attending: Hematology and Oncology

## 2019-04-26 ENCOUNTER — Telehealth: Payer: Self-pay | Admitting: Hematology and Oncology

## 2019-04-26 VITALS — BP 126/70 | HR 87 | Temp 98.3°F | Resp 18 | Ht 59.0 in | Wt 161.8 lb

## 2019-04-26 VITALS — BP 142/82 | HR 71 | Temp 99.3°F | Resp 18

## 2019-04-26 DIAGNOSIS — D500 Iron deficiency anemia secondary to blood loss (chronic): Secondary | ICD-10-CM | POA: Insufficient documentation

## 2019-04-26 DIAGNOSIS — N920 Excessive and frequent menstruation with regular cycle: Secondary | ICD-10-CM | POA: Insufficient documentation

## 2019-04-26 DIAGNOSIS — D509 Iron deficiency anemia, unspecified: Secondary | ICD-10-CM

## 2019-04-26 DIAGNOSIS — S91302D Unspecified open wound, left foot, subsequent encounter: Secondary | ICD-10-CM | POA: Diagnosis not present

## 2019-04-26 DIAGNOSIS — Z87891 Personal history of nicotine dependence: Secondary | ICD-10-CM

## 2019-04-26 DIAGNOSIS — S81802A Unspecified open wound, left lower leg, initial encounter: Secondary | ICD-10-CM

## 2019-04-26 DIAGNOSIS — Z79899 Other long term (current) drug therapy: Secondary | ICD-10-CM | POA: Insufficient documentation

## 2019-04-26 DIAGNOSIS — S81809A Unspecified open wound, unspecified lower leg, initial encounter: Secondary | ICD-10-CM | POA: Insufficient documentation

## 2019-04-26 LAB — CBC WITH DIFFERENTIAL/PLATELET
Abs Immature Granulocytes: 0.02 10*3/uL (ref 0.00–0.07)
Basophils Absolute: 0 10*3/uL (ref 0.0–0.1)
Basophils Relative: 1 %
Eosinophils Absolute: 0.1 10*3/uL (ref 0.0–0.5)
Eosinophils Relative: 1 %
HCT: 26.2 % — ABNORMAL LOW (ref 36.0–46.0)
Hemoglobin: 6.9 g/dL — CL (ref 12.0–15.0)
Immature Granulocytes: 0 %
Lymphocytes Relative: 28 %
Lymphs Abs: 1.9 10*3/uL (ref 0.7–4.0)
MCH: 18.3 pg — ABNORMAL LOW (ref 26.0–34.0)
MCHC: 26.3 g/dL — ABNORMAL LOW (ref 30.0–36.0)
MCV: 69.5 fL — ABNORMAL LOW (ref 80.0–100.0)
Monocytes Absolute: 0.4 10*3/uL (ref 0.1–1.0)
Monocytes Relative: 5 %
Neutro Abs: 4.3 10*3/uL (ref 1.7–7.7)
Neutrophils Relative %: 65 %
Platelets: 575 10*3/uL — ABNORMAL HIGH (ref 150–400)
RBC: 3.77 MIL/uL — ABNORMAL LOW (ref 3.87–5.11)
RDW: 23.1 % — ABNORMAL HIGH (ref 11.5–15.5)
WBC: 6.7 10*3/uL (ref 4.0–10.5)
nRBC: 0.3 % — ABNORMAL HIGH (ref 0.0–0.2)

## 2019-04-26 LAB — FERRITIN: Ferritin: <4 ng/mL — ABNORMAL LOW (ref 11–307)

## 2019-04-26 LAB — IRON AND TIBC
Iron: 17 ug/dL — ABNORMAL LOW (ref 41–142)
Saturation Ratios: 3 % — ABNORMAL LOW (ref 21–57)
TIBC: 556 ug/dL — ABNORMAL HIGH (ref 236–444)
UIBC: 539 ug/dL — ABNORMAL HIGH (ref 120–384)

## 2019-04-26 LAB — PREPARE RBC (CROSSMATCH)

## 2019-04-26 LAB — ABO/RH: ABO/RH(D): B POS

## 2019-04-26 MED ORDER — SODIUM CHLORIDE 0.9 % IV SOLN
510.0000 mg | Freq: Once | INTRAVENOUS | Status: DC
  Filled 2019-04-26: qty 17

## 2019-04-26 MED ORDER — DIPHENHYDRAMINE HCL 25 MG PO CAPS
25.0000 mg | ORAL_CAPSULE | Freq: Once | ORAL | Status: AC
  Administered 2019-04-26: 25 mg via ORAL

## 2019-04-26 MED ORDER — DIPHENHYDRAMINE HCL 25 MG PO CAPS
ORAL_CAPSULE | ORAL | Status: AC
  Filled 2019-04-26: qty 1

## 2019-04-26 MED ORDER — SODIUM CHLORIDE 0.9% IV SOLUTION
250.0000 mL | Freq: Once | INTRAVENOUS | Status: AC
  Administered 2019-04-26: 250 mL via INTRAVENOUS
  Filled 2019-04-26: qty 250

## 2019-04-26 MED ORDER — FAMOTIDINE IN NACL 20-0.9 MG/50ML-% IV SOLN
20.0000 mg | Freq: Once | INTRAVENOUS | Status: AC
  Administered 2019-04-26: 14:00:00 20 mg via INTRAVENOUS

## 2019-04-26 MED ORDER — ACETAMINOPHEN 325 MG PO TABS
650.0000 mg | ORAL_TABLET | Freq: Once | ORAL | Status: AC
  Administered 2019-04-26: 650 mg via ORAL

## 2019-04-26 MED ORDER — ACETAMINOPHEN 325 MG PO TABS
ORAL_TABLET | ORAL | Status: AC
  Filled 2019-04-26: qty 2

## 2019-04-26 NOTE — Assessment & Plan Note (Signed)
She has chronic nonhealing wound I recommend blood transfusion first followed by iron tomorrow The rationale of the strategies discussed and she agreed to proceed

## 2019-04-26 NOTE — Progress Notes (Signed)
During blood infusion, pt began to report "my throat feels funny" along with throat and chest tightness and discomfort. RBC infusion was paused and MD Gorsuch notified. MD Alvy Bimler at bedside. VSS (see flowsheets) and pepcid given (see eMAR). Pt denies wanting any pain medication for her chest discomfort at this time. Per MD Alvy Bimler, ok to resume RBC infusion after pepcid is complete.

## 2019-04-26 NOTE — Telephone Encounter (Signed)
I evaluated the patient briefly in the infusion room for questionable infusion reaction What she is describing sounds like esophagitis/esophageal spasm I recommend 1 dose of Pepcid We will proceed with the rest of the blood transfusion after that I recommend IV iron tomorrow instead of today

## 2019-04-26 NOTE — Assessment & Plan Note (Signed)
She has severe menorrhagia This is the cause of her severe anemia She has appointment to visit with gynecologist for further management

## 2019-04-26 NOTE — Progress Notes (Signed)
[facility] FOLLOW-UP progress notes  Patient Care Team: Rikki Spearing, NP as PCP - General (Nurse Practitioner) Heath Lark, MD as Consulting Physician (Hematology and Oncology) Christophe Louis, MD as Consulting Physician (Obstetrics and Gynecology)  CHIEF COMPLAINTS/PURPOSE OF VISIT:  Recurrent, severe iron deficiency anemia with chronic nonhealing wound  HISTORY OF PRESENTING ILLNESS:  Sherri Douglas 38 y.o. female was referred back to see me due to severe recurrent iron deficiency anemia She was last seen in 2017  Last time she was seen here, it was because of severe iron deficiency anemia in the context of pregnancy. The patient is currently pregnant with her third child. She is currently [redacted] weeks pregnant with expected due date around 04/12/2016. She has 2 older children; her son is 4 and her daughter is 59 years old. She denies diagnosis of anemia pregnancy with her other 2 children. The patient denies history of menorrhagia. She was found to have abnormal CBC from recent blood work monitoring. The patient was admitted briefly to the [facility] around February of this year with profound microcytic anemia with hemoglobin of 7.9. On 10/01/2015, hemoglobin was 8.3 with low MCV On 01/27/2016, hemoglobin is low at 7.7 with low MCV again. Her last iron studies on 10/01/2015 was low with ferritin of 4.7. She received intravenous iron in the past without complications She had no prior history or diagnosis of cancer. Her age appropriate screening programs are up-to-date. She complained of pica with excessive chewing of ice and eats a variety of diet. She had donated blood twice many years ago but has never received blood transfusion The patient was prescribed oral iron supplements and she takes sporadically. She cannot tolerate oral iron supplement due to severe nausea associated with it.  She denies recent chest pain on exertion, shortness of breath on minimal exertion or  palpitations.  She had not noticed any recent bleeding such as epistaxis, hematuria or hematochezia She takes Motrin for pain menstruation cramps  Most recently, she developed severe nonhealing wound on her left foot It is causing significant pain Repeat blood count recently show recurrent microcytic anemia with iron deficiency She has significant menorrhagia She has appointment pending to see gynecologist  MEDICAL HISTORY:  Past Medical History:  Diagnosis Date  . Abnormal Pap smear   . Anemia   . Chlamydia   . Gestational diabetes   . Trichomoniasis of vagina 2015  . Urinary tract infection     SURGICAL HISTORY: Past Surgical History:  Procedure Laterality Date  . INDUCED ABORTION    . WISDOM TOOTH EXTRACTION      SOCIAL HISTORY: Social History   Socioeconomic History  . Marital status: Single    Spouse name: Not on file  . Number of children: Not on file  . Years of education: Not on file  . Highest education level: Not on file  Occupational History  . Not on file  Social Needs  . Financial resource strain: Not on file  . Food insecurity    Worry: Not on file    Inability: Not on file  . Transportation needs    Medical: Not on file    Non-medical: Not on file  Tobacco Use  . Smoking status: Former Research scientist (life sciences)  . Smokeless tobacco: Never Used  Substance and Sexual Activity  . Alcohol use: Yes    Comment: socially  . Drug use: No  . 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 Physical activity    Days per week: Not on file    Minutes per session: Not on file  . Stress: Not on file  Relationships  . Social Herbalist on phone: Not on file    Gets together: Not on file    Attends religious service: Not on file    Active member of club or organization: Not on file    Attends meetings of clubs or organizations: Not on file    Relationship status: Not on file  . Intimate partner violence     Fear of current or ex partner: Not on file    Emotionally abused: Not on file    Physically abused: Not on file    Forced sexual activity: Not on file  Other Topics Concern  . Not on file  Social History Narrative  . Not on file    FAMILY HISTORY: Family History  Problem Relation Age of Onset  . Diabetes Father   . Hypertension Father   . Diabetes Paternal Aunt   . Hypertension Paternal Aunt   . Diabetes Paternal Grandmother   . Hypertension Paternal Grandmother   . Diabetes Paternal Grandfather   . Hypertension Paternal Grandfather   . Anesthesia problems Neg Hx     ALLERGIES:  is allergic to amoxicillin.  MEDICATIONS:  Current Outpatient Medications  Medication Sig Dispense Refill  . NIFEdipine (PROCARDIA-XL/ADALAT CC) 30 MG 24 hr tablet Take 1 tablet (30 mg total) by mouth daily. 30 tablet 2  . oxyCODONE-acetaminophen (PERCOCET/ROXICET) 5-325 MG tablet Take 1-2 tablets by mouth every 4 (four) hours as needed for moderate pain or severe pain. 20 tablet 0  . Prenatal MV-Min-Fe Fum-FA-DHA (PRENATAL 1 PO) Take 1 tablet by mouth daily.      No current facility-administered medications for this visit.     REVIEW OF SYSTEMS:   Constitutional: Denies fevers, chills or abnormal night sweats Eyes: Denies blurriness of vision, double vision or watery eyes Ears, nose, mouth, throat, and face: Denies mucositis or sore throat Respiratory: Denies cough, dyspnea or wheezes Cardiovascular: Denies palpitation, chest discomfort or lower extremity swelling Gastrointestinal:  Denies nausea, heartburn or change in bowel habits Lymphatics: Denies new lymphadenopathy or easy bruising Neurological:Denies numbness, tingling or new weaknesses Behavioral/Psych: Mood is stable, no new changes  All other systems were reviewed with the patient and are negative.  PHYSICAL EXAMINATION: ECOG PERFORMANCE STATUS: 1 - Symptomatic but completely ambulatory  Vitals:   04/26/19 1038  BP: 126/70   Pulse: 87  Resp: 18  Temp: 98.3 F (36.8 C)  SpO2: 100%   Filed Weights   04/26/19 1038  Weight: 161 lb 12.8 oz (73.4 kg)    GENERAL:alert, no distress and comfortable.  She looks pale SKIN: Noted nonhealing wound on her left foot PSYCH: alert & oriented x 3 with fluent speech.  She is tearful NEURO: no focal motor/sensory deficits  LABORATORY DATA:  I have reviewed the data as listed Lab Results  Component Value Date   WBC 6.7 04/26/2019   HGB 6.9 (LL) 04/26/2019   HCT 26.2 (L) 04/26/2019   MCV 69.5 (L) 04/26/2019   PLT 575 (H) 04/26/2019   No results for input(s): NA, K, CL, CO2, GLUCOSE, BUN, CREATININE, CALCIUM, GFRNONAA, GFRAA, PROT, ALBUMIN, AST, ALT, ALKPHOS, BILITOT, BILIDIR, IBILI in the last 8760 hours.  RADIOGRAPHIC STUDIES: I have personally reviewed the radiological images as listed and agreed with the findings in the report. Dg Foot Complete Left  Result Date: 04/12/2019 CLINICAL DATA:  Wound infection. EXAM: LEFT FOOT - COMPLETE 3+ VIEW COMPARISON:  None. FINDINGS: There is no evidence of fracture or dislocation. There is no evidence of arthropathy or other focal bone abnormality. Soft tissues are unremarkable. No radiopaque foreign body is noted. IMPRESSION: Negative. Electronically Signed   By: Marijo Conception M.D.   On: 04/12/2019 16:40    ASSESSMENT & PLAN:  Iron deficiency anemia She has severe anemia and nonhealing wound  We discussed some of the risks, benefits, and alternatives of blood transfusions. The patient is symptomatic from anemia and the hemoglobin level is critically low.  Some of the side-effects to be expected including risks of transfusion reactions, chills, infection, syndrome of volume overload and risk of hospitalization from various reasons and the patient is willing to proceed and went ahead to sign consent today. I recommend 1 unit of blood today She will receive intravenous iron for 2 weeks starting tomorrow  The most likely  cause of her anemia is due to chronic blood loss/malabsorption syndrome. We discussed some of the risks, benefits, and alternatives of intravenous iron infusions. The patient is symptomatic from anemia and the iron level is critically low. She tolerated oral iron supplement poorly and desires to achieved higher levels of iron faster for adequate hematopoesis. Some of the side-effects to be expected including risks of infusion reactions, phlebitis, headaches, nausea and fatigue.  The patient is willing to proceed. Patient education material was dispensed.  Goal is to keep ferritin level greater than 50 and resolution of anemia I plan to recheck her blood count again next month and will call her with test results   Menorrhagia She has severe menorrhagia This is the cause of her severe anemia She has appointment to visit with gynecologist for further management  Non-healing wound of lower extremity She has chronic nonhealing wound I recommend blood transfusion first followed by iron tomorrow The rationale of the strategies discussed and she agreed to proceed   Orders Placed This Encounter  Procedures  . Practitioner attestation of consent    I, the ordering practitioner, attest that I have discussed with the patient the benefits, risks, side effects, alternatives, likelihood of achieving goals and potential problems during recovery for the procedure listed.    Standing Status:   Future    Standing Expiration Date:   04/25/2020    Order Specific Question:   Procedure    Answer:   Blood Product(s)  . Complete patient signature process for consent form    Standing Status:   Future    Standing Expiration Date:   04/25/2020  . Care order/instruction    Transfuse Parameters    Standing Status:   Future    Standing Expiration Date:   04/25/2020  . Type and screen    Standing Status:   Future    Number of Occurrences:   1    Standing Expiration Date:   04/25/2020    All questions were  answered. The patient knows to call the clinic with any problems, questions or concerns. I spent 30 minutes counseling the patient face to face. The total time spent in the appointment was 40 minutes and more than 50% was on counseling.     Heath Lark, MD 04/26/2019 11:14 AM

## 2019-04-26 NOTE — Assessment & Plan Note (Signed)
She has severe anemia and nonhealing wound  We discussed some of the risks, benefits, and alternatives of blood transfusions. The patient is symptomatic from anemia and the hemoglobin level is critically low.  Some of the side-effects to be expected including risks of transfusion reactions, chills, infection, syndrome of volume overload and risk of hospitalization from various reasons and the patient is willing to proceed and went ahead to sign consent today. I recommend 1 unit of blood today She will receive intravenous iron for 2 weeks starting tomorrow  The most likely cause of her anemia is due to chronic blood loss/malabsorption syndrome. We discussed some of the risks, benefits, and alternatives of intravenous iron infusions. The patient is symptomatic from anemia and the iron level is critically low. She tolerated oral iron supplement poorly and desires to achieved higher levels of iron faster for adequate hematopoesis. Some of the side-effects to be expected including risks of infusion reactions, phlebitis, headaches, nausea and fatigue.  The patient is willing to proceed. Patient education material was dispensed.  Goal is to keep ferritin level greater than 50 and resolution of anemia I plan to recheck her blood count again next month and will call her with test results

## 2019-04-26 NOTE — Patient Instructions (Signed)
Blood Transfusion, Adult, Care After This sheet gives you information about how to care for yourself after your procedure. Your health care provider may also give you more specific instructions. If you have problems or questions, contact your health care provider. What can I expect after the procedure? After your procedure, it is common to have:  Bruising and soreness where the IV tube was inserted.  Headache. Follow these instructions at home:   Take over-the-counter and prescription medicines only as told by your health care provider.  Return to your normal activities as told by your health care provider.  Follow instructions from your health care provider about how to take care of your IV insertion site. Make sure you: ? Wash your hands with soap and water before you change your bandage (dressing). If soap and water are not available, use hand sanitizer. ? Change your dressing as told by your health care provider.  Check your IV insertion site every day for signs of infection. Check for: ? More redness, swelling, or pain. ? More fluid or blood. ? Warmth. ? Pus or a bad smell. Contact a health care provider if:  You have more redness, swelling, or pain around the IV insertion site.  You have more fluid or blood coming from the IV insertion site.  Your IV insertion site feels warm to the touch.  You have pus or a bad smell coming from the IV insertion site.  Your urine turns pink, red, or brown.  You feel weak after doing your normal activities. Get help right away if:  You have signs of a serious allergic or immune system reaction, including: ? Itchiness. ? Hives. ? Trouble breathing. ? Anxiety. ? Chest or lower back pain. ? Fever, flushing, and chills. ? Rapid pulse. ? Rash. ? Diarrhea. ? Vomiting. ? Dark urine. ? Serious headache. ? Dizziness. ? Stiff neck. ? Yellow coloration of the face or the white parts of the eyes (jaundice). This information is not  intended to replace advice given to you by your health care provider. Make sure you discuss any questions you have with your health care provider. Document Released: 08/09/2014 Document Revised: 05/16/2017 Document Reviewed: 02/02/2016 Elsevier Patient Education  2020 [employer]  

## 2019-04-27 ENCOUNTER — Other Ambulatory Visit: Payer: Self-pay

## 2019-04-27 ENCOUNTER — Inpatient Hospital Stay: Payer: BC Managed Care – PPO

## 2019-04-27 VITALS — BP 153/87 | HR 71 | Temp 98.5°F | Resp 16

## 2019-04-27 DIAGNOSIS — D500 Iron deficiency anemia secondary to blood loss (chronic): Secondary | ICD-10-CM | POA: Diagnosis not present

## 2019-04-27 DIAGNOSIS — D509 Iron deficiency anemia, unspecified: Secondary | ICD-10-CM

## 2019-04-27 LAB — TYPE AND SCREEN
ABO/RH(D): B POS
Antibody Screen: NEGATIVE
Unit division: 0

## 2019-04-27 LAB — BPAM RBC
Blood Product Expiration Date: 202010062359
ISSUE DATE / TIME: 202009241304
Unit Type and Rh: 7300

## 2019-04-27 MED ORDER — ACETAMINOPHEN 325 MG PO TABS
ORAL_TABLET | ORAL | Status: AC
  Filled 2019-04-27: qty 2

## 2019-04-27 MED ORDER — ACETAMINOPHEN 325 MG PO TABS
650.0000 mg | ORAL_TABLET | Freq: Once | ORAL | Status: AC
  Administered 2019-04-27: 650 mg via ORAL

## 2019-04-27 MED ORDER — SODIUM CHLORIDE 0.9 % IV SOLN
Freq: Once | INTRAVENOUS | Status: AC
  Administered 2019-04-27: 09:00:00 via INTRAVENOUS
  Filled 2019-04-27: qty 250

## 2019-04-27 MED ORDER — DIPHENHYDRAMINE HCL 25 MG PO CAPS
ORAL_CAPSULE | ORAL | Status: AC
  Filled 2019-04-27: qty 2

## 2019-04-27 MED ORDER — DIPHENHYDRAMINE HCL 25 MG PO CAPS
50.0000 mg | ORAL_CAPSULE | Freq: Once | ORAL | Status: AC
  Administered 2019-04-27: 09:00:00 50 mg via ORAL

## 2019-04-27 MED ORDER — SODIUM CHLORIDE 0.9 % IV SOLN
510.0000 mg | Freq: Once | INTRAVENOUS | Status: AC
  Administered 2019-04-27: 510 mg via INTRAVENOUS
  Filled 2019-04-27: qty 510

## 2019-04-27 NOTE — Patient Instructions (Signed)

## 2019-05-04 ENCOUNTER — Other Ambulatory Visit: Payer: Self-pay | Admitting: Hematology and Oncology

## 2019-05-04 ENCOUNTER — Inpatient Hospital Stay: Payer: BC Managed Care – PPO | Attending: Hematology and Oncology

## 2019-05-04 ENCOUNTER — Other Ambulatory Visit: Payer: Self-pay

## 2019-05-04 VITALS — BP 146/98 | HR 70 | Temp 98.9°F | Resp 18

## 2019-05-04 DIAGNOSIS — N920 Excessive and frequent menstruation with regular cycle: Secondary | ICD-10-CM | POA: Diagnosis present

## 2019-05-04 DIAGNOSIS — D509 Iron deficiency anemia, unspecified: Secondary | ICD-10-CM

## 2019-05-04 DIAGNOSIS — D500 Iron deficiency anemia secondary to blood loss (chronic): Secondary | ICD-10-CM | POA: Insufficient documentation

## 2019-05-04 MED ORDER — ACETAMINOPHEN 325 MG PO TABS
ORAL_TABLET | ORAL | Status: AC
  Filled 2019-05-04: qty 2

## 2019-05-04 MED ORDER — DIPHENHYDRAMINE HCL 25 MG PO CAPS
50.0000 mg | ORAL_CAPSULE | Freq: Once | ORAL | Status: AC
  Administered 2019-05-04: 50 mg via ORAL

## 2019-05-04 MED ORDER — ACETAMINOPHEN 325 MG PO TABS
650.0000 mg | ORAL_TABLET | Freq: Once | ORAL | Status: AC
  Administered 2019-05-04: 650 mg via ORAL

## 2019-05-04 MED ORDER — SODIUM CHLORIDE 0.9 % IV SOLN
Freq: Once | INTRAVENOUS | Status: AC
  Administered 2019-05-04: 09:00:00 via INTRAVENOUS
  Filled 2019-05-04: qty 250

## 2019-05-04 MED ORDER — SODIUM CHLORIDE 0.9 % IV SOLN
510.0000 mg | Freq: Once | INTRAVENOUS | Status: AC
  Administered 2019-05-04: 510 mg via INTRAVENOUS
  Filled 2019-05-04: qty 17

## 2019-05-04 MED ORDER — DIPHENHYDRAMINE HCL 25 MG PO CAPS
ORAL_CAPSULE | ORAL | Status: AC
  Filled 2019-05-04: qty 2

## 2019-05-04 NOTE — Patient Instructions (Signed)

## 2019-05-31 ENCOUNTER — Inpatient Hospital Stay: Payer: BC Managed Care – PPO

## 2019-06-12 ENCOUNTER — Other Ambulatory Visit: Payer: Self-pay | Admitting: Obstetrics and Gynecology

## 2021-02-14 ENCOUNTER — Encounter: Payer: Self-pay | Admitting: Hematology and Oncology

## 2021-04-05 ENCOUNTER — Inpatient Hospital Stay (HOSPITAL_COMMUNITY)
Admission: EM | Admit: 2021-04-05 | Discharge: 2021-04-07 | DRG: 176 | Disposition: A | Discharge status: Home / Self Care | Payer: BC Managed Care – PPO | Attending: Internal Medicine | Admitting: Internal Medicine

## 2021-04-05 ENCOUNTER — Encounter (HOSPITAL_COMMUNITY): Payer: Self-pay | Admitting: Emergency Medicine

## 2021-04-05 ENCOUNTER — Encounter: Payer: Self-pay | Admitting: Hematology and Oncology

## 2021-04-05 ENCOUNTER — Emergency Department (HOSPITAL_COMMUNITY): Payer: BC Managed Care – PPO

## 2021-04-05 ENCOUNTER — Other Ambulatory Visit: Payer: Self-pay

## 2021-04-05 DIAGNOSIS — Z791 Long term (current) use of non-steroidal anti-inflammatories (NSAID): Secondary | ICD-10-CM

## 2021-04-05 DIAGNOSIS — D649 Anemia, unspecified: Secondary | ICD-10-CM | POA: Diagnosis not present

## 2021-04-05 DIAGNOSIS — Z8249 Family history of ischemic heart disease and other diseases of the circulatory system: Secondary | ICD-10-CM | POA: Diagnosis not present

## 2021-04-05 DIAGNOSIS — N920 Excessive and frequent menstruation with regular cycle: Secondary | ICD-10-CM | POA: Diagnosis present

## 2021-04-05 DIAGNOSIS — Z87891 Personal history of nicotine dependence: Secondary | ICD-10-CM | POA: Diagnosis not present

## 2021-04-05 DIAGNOSIS — Z20822 Contact with and (suspected) exposure to covid-19: Secondary | ICD-10-CM | POA: Diagnosis present

## 2021-04-05 DIAGNOSIS — I2782 Chronic pulmonary embolism: Secondary | ICD-10-CM | POA: Diagnosis present

## 2021-04-05 DIAGNOSIS — Z880 Allergy status to penicillin: Secondary | ICD-10-CM | POA: Diagnosis not present

## 2021-04-05 DIAGNOSIS — R778 Other specified abnormalities of plasma proteins: Secondary | ICD-10-CM | POA: Diagnosis present

## 2021-04-05 DIAGNOSIS — R0602 Shortness of breath: Secondary | ICD-10-CM | POA: Diagnosis not present

## 2021-04-05 DIAGNOSIS — D259 Leiomyoma of uterus, unspecified: Secondary | ICD-10-CM | POA: Diagnosis present

## 2021-04-05 DIAGNOSIS — R079 Chest pain, unspecified: Secondary | ICD-10-CM | POA: Diagnosis not present

## 2021-04-05 DIAGNOSIS — Z79899 Other long term (current) drug therapy: Secondary | ICD-10-CM

## 2021-04-05 DIAGNOSIS — R7989 Other specified abnormal findings of blood chemistry: Secondary | ICD-10-CM | POA: Diagnosis present

## 2021-04-05 DIAGNOSIS — I2699 Other pulmonary embolism without acute cor pulmonale: Principal | ICD-10-CM | POA: Diagnosis present

## 2021-04-05 DIAGNOSIS — I214 Non-ST elevation (NSTEMI) myocardial infarction: Secondary | ICD-10-CM | POA: Diagnosis not present

## 2021-04-05 DIAGNOSIS — D509 Iron deficiency anemia, unspecified: Secondary | ICD-10-CM | POA: Diagnosis present

## 2021-04-05 LAB — BASIC METABOLIC PANEL
Anion gap: 9 (ref 5–15)
BUN: 9 mg/dL (ref 6–20)
CO2: 21 mmol/L — ABNORMAL LOW (ref 22–32)
Calcium: 9.1 mg/dL (ref 8.9–10.3)
Chloride: 106 mmol/L (ref 98–111)
Creatinine, Ser: 0.6 mg/dL (ref 0.44–1.00)
GFR, Estimated: >60 mL/min (ref >60)
Glucose, Bld: 108 mg/dL — ABNORMAL HIGH (ref 70–99)
Potassium: 3.8 mmol/L (ref 3.5–5.1)
Sodium: 136 mmol/L (ref 135–145)

## 2021-04-05 LAB — CBC WITH DIFFERENTIAL/PLATELET
Abs Immature Granulocytes: 0.08 10*3/uL — ABNORMAL HIGH (ref 0.00–0.07)
Basophils Absolute: 0.1 10*3/uL (ref 0.0–0.1)
Basophils Relative: 0 %
Eosinophils Absolute: 0.1 10*3/uL (ref 0.0–0.5)
Eosinophils Relative: 0 %
HCT: 26.1 % — ABNORMAL LOW (ref 36.0–46.0)
Hemoglobin: 6.6 g/dL — CL (ref 12.0–15.0)
Immature Granulocytes: 1 %
Lymphocytes Relative: 16 %
Lymphs Abs: 2.3 10*3/uL (ref 0.7–4.0)
MCH: 17.5 pg — ABNORMAL LOW (ref 26.0–34.0)
MCHC: 25.3 g/dL — ABNORMAL LOW (ref 30.0–36.0)
MCV: 69 fL — ABNORMAL LOW (ref 80.0–100.0)
Monocytes Absolute: 0.7 10*3/uL (ref 0.1–1.0)
Monocytes Relative: 5 %
Neutro Abs: 11 10*3/uL — ABNORMAL HIGH (ref 1.7–7.7)
Neutrophils Relative %: 78 %
Platelets: 658 10*3/uL — ABNORMAL HIGH (ref 150–400)
RBC: 3.78 MIL/uL — ABNORMAL LOW (ref 3.87–5.11)
RDW: 22.5 % — ABNORMAL HIGH (ref 11.5–15.5)
WBC: 14.2 10*3/uL — ABNORMAL HIGH (ref 4.0–10.5)
nRBC: 0.2 % (ref 0.0–0.2)

## 2021-04-05 LAB — RESP PANEL BY RT-PCR (FLU A&B, COVID) ARPGX2
Influenza A by PCR: NEGATIVE
Influenza B by PCR: NEGATIVE
SARS Coronavirus 2 by RT PCR: NEGATIVE

## 2021-04-05 LAB — TROPONIN I (HIGH SENSITIVITY)
Troponin I (High Sensitivity): 166 ng/L (ref <18)
Troponin I (High Sensitivity): 814 ng/L (ref <18)

## 2021-04-05 LAB — I-STAT BETA HCG BLOOD, ED (MC, WL, AP ONLY): I-stat hCG, quantitative: <5 m[IU]/mL (ref <5)

## 2021-04-05 LAB — PROTIME-INR
INR: 1 (ref 0.8–1.2)
Prothrombin Time: 12.8 seconds (ref 11.4–15.2)

## 2021-04-05 LAB — PREPARE RBC (CROSSMATCH)

## 2021-04-05 LAB — BRAIN NATRIURETIC PEPTIDE: B Natriuretic Peptide: 43.5 pg/mL (ref 0.0–100.0)

## 2021-04-05 LAB — APTT: aPTT: 24 seconds (ref 24–36)

## 2021-04-05 MED ORDER — ACETAMINOPHEN 325 MG PO TABS
650.0000 mg | ORAL_TABLET | Freq: Four times a day (QID) | ORAL | Status: DC | PRN
  Administered 2021-04-06: 650 mg via ORAL
  Filled 2021-04-05: qty 2

## 2021-04-05 MED ORDER — FENTANYL CITRATE PF 50 MCG/ML IJ SOSY
50.0000 ug | PREFILLED_SYRINGE | Freq: Once | INTRAMUSCULAR | Status: AC
  Administered 2021-04-05: 50 ug via INTRAVENOUS
  Filled 2021-04-05: qty 1

## 2021-04-05 MED ORDER — HEPARIN (PORCINE) 25000 UT/250ML-% IV SOLN
1250.0000 [IU]/h | INTRAVENOUS | Status: DC
  Administered 2021-04-05: 950 [IU]/h via INTRAVENOUS
  Administered 2021-04-06: 1150 [IU]/h via INTRAVENOUS
  Filled 2021-04-05 (×2): qty 250

## 2021-04-05 MED ORDER — HEPARIN BOLUS VIA INFUSION
1500.0000 [IU] | Freq: Once | INTRAVENOUS | Status: AC
  Administered 2021-04-05: 1500 [IU] via INTRAVENOUS
  Filled 2021-04-05: qty 1500

## 2021-04-05 MED ORDER — SODIUM CHLORIDE 0.9 % IV SOLN: 10.0000 mL/h | Freq: Once | INTRAVENOUS | Status: DC

## 2021-04-05 MED ORDER — ASPIRIN 81 MG PO CHEW
81.0000 mg | CHEWABLE_TABLET | Freq: Every day | ORAL | Status: DC
  Administered 2021-04-06 – 2021-04-07 (×3): 81 mg via ORAL
  Filled 2021-04-05 (×3): qty 1

## 2021-04-05 MED ORDER — ACETAMINOPHEN 650 MG RE SUPP: 650.0000 mg | Freq: Four times a day (QID) | RECTAL | Status: DC | PRN

## 2021-04-05 MED ORDER — IOHEXOL 350 MG/ML SOLN
50.0000 mL | Freq: Once | INTRAVENOUS | Status: AC | PRN
  Administered 2021-04-05: 50 mL via INTRAVENOUS

## 2021-04-05 NOTE — ED Provider Notes (Signed)
[facility] EMERGENCY DEPARTMENT Provider Note   CSN: [id number] Arrival date & time: 04/05/21  1844     History Chief Complaint  Patient presents with   Shortness of Breath    Sherri Douglas is a 40 y.o. female.  HPI 40 year old female presents with acute dyspnea and chest pain.  Started around 5:30 PM.  She was getting her hair done today and was sitting for about 6 hours.  While sitting she did feel a sudden pain in her right calf that slowly went away.  Not sure how long it lasted.  When she got up to walk however she felt short of breath and chest discomfort.  It is in the middle of her chest and feels like reflux.  Is also in her throat.  This is much worse when she stands up to walk around though is not completely relieved by rest.  She is having 4 out of 10 discomfort right now.  Sats have been as low as 88% and she is now on 2 L.  No fevers or cough.  No prior history of PE.  No recent travel or surgery or OCP use.  Past Medical History:  Diagnosis Date   Abnormal Pap smear    Anemia    Chlamydia    Gestational diabetes    Trichomoniasis of vagina 2015   Urinary tract infection     Patient Active Problem List   Diagnosis Date Noted   Acute pulmonary embolism ([place]) 04/05/2021   Elevated troponin 04/05/2021   Menorrhagia 04/26/2019   Non-healing wound of lower extremity 04/26/2019   Preeclampsia in postpartum period 04/28/2016   Post-dates pregnancy 04/20/2016   Vaginal delivery 04/20/2016   Iron deficiency anemia 02/10/2016   Anemia affecting pregnancy in third trimester, antepartum 02/10/2016    Past Surgical History:  Procedure Laterality Date   INDUCED ABORTION     WISDOM TOOTH EXTRACTION       OB History     Gravida  4   Para  3   Term  3   Preterm  0   AB  1   Living  3      SAB  0   IAB  1   Ectopic  0   Multiple  0   Live Births  2           Family History  Problem Relation Age of Onset   Diabetes Father     Hypertension Father    Diabetes Paternal Aunt    Hypertension Paternal Aunt    Diabetes Paternal Grandmother    Hypertension Paternal Grandmother    Diabetes Paternal Grandfather    Hypertension Paternal Grandfather    Anesthesia problems Neg Hx     Social History   Tobacco Use   Smoking status: Former   Smokeless tobacco: Never  Substance Use Topics   Alcohol use: Yes    Comment: socially   Drug use: No    Home Medications Prior to Admission medications   Medication Sig Start Date End Date Taking? Authorizing Provider  ibuprofen (ADVIL) 200 MG tablet Take 400 mg by mouth every 6 (six) hours as needed for headache or moderate pain.   Yes [provider]  naproxen sodium (ALEVE) 220 MG tablet Take 440 mg by mouth daily as needed (pain).   Yes [provider]  NIFEdipine (PROCARDIA-XL/ADALAT CC) 30 MG 24 hr tablet Take 1 tablet (30 mg total) by mouth daily. Patient not taking: No sig  reported 04/30/16   Thurnell Lose, MD  oxyCODONE-acetaminophen (PERCOCET/ROXICET) 5-325 MG tablet Take 1-2 tablets by mouth every 4 (four) hours as needed for moderate pain or severe pain. Patient not taking: No sig reported 04/30/16   Thurnell Lose, MD    Allergies    Amoxicillin  Review of Systems   Review of Systems  Constitutional:  Negative for fever.  Respiratory:  Positive for shortness of breath. Negative for cough.   Cardiovascular:  Positive for chest pain. Negative for leg swelling.  Musculoskeletal:  Positive for myalgias.  Neurological:  Positive for light-headedness.  All other systems reviewed and are negative.  Physical Exam Updated Vital Signs BP (!) 155/111   Pulse 100   Temp 99 F (37.2 C) (Oral)   Resp (!) 26   Ht '4\' 11"'$  (1.499 m)   Wt 68 kg   SpO2 100%   BMI 30.30 kg/m   Physical Exam Vitals and nursing note reviewed.  Constitutional:      Appearance: She is well-developed.  HENT:     Head: Normocephalic and atraumatic.     Right  Ear: External ear normal.     Left Ear: External ear normal.     Nose: Nose normal.  Eyes:     General:        Right eye: No discharge.        Left eye: No discharge.  Cardiovascular:     Rate and Rhythm: Regular rhythm. Tachycardia present.     Heart sounds: Normal heart sounds.  Pulmonary:     Effort: Pulmonary effort is normal. Tachypnea present.     Breath sounds: Normal breath sounds.  Abdominal:     Palpations: Abdomen is soft.     Tenderness: There is no abdominal tenderness.  Musculoskeletal:     Right lower leg: No tenderness. No edema.     Left lower leg: No tenderness. No edema.  Skin:    General: Skin is warm and dry.  Neurological:     Mental Status: She is alert.  Psychiatric:        Mood and Affect: Mood is not anxious.    ED Results / Procedures / Treatments   Labs (all labs ordered are listed, but only abnormal results are displayed) Labs Reviewed  CBC WITH DIFFERENTIAL/PLATELET - Abnormal; Notable for the following components:      Result Value   WBC 14.2 (*)    RBC 3.78 (*)    Hemoglobin 6.6 (*)    HCT 26.1 (*)    MCV 69.0 (*)    MCH 17.5 (*)    MCHC 25.3 (*)    RDW 22.5 (*)    Platelets 658 (*)    Neutro Abs 11.0 (*)    Abs Immature Granulocytes 0.08 (*)    All other components within normal limits  BASIC METABOLIC PANEL - Abnormal; Notable for the following components:   CO2 21 (*)    Glucose, Bld 108 (*)    All other components within normal limits  TROPONIN I (HIGH SENSITIVITY) - Abnormal; Notable for the following components:   Troponin I (High Sensitivity) 166 (*)    All other components within normal limits  TROPONIN I (HIGH SENSITIVITY) - Abnormal; Notable for the following components:   Troponin I (High Sensitivity) 814 (*)    All other components within normal limits  RESP PANEL BY RT-PCR (FLU A&B, COVID) ARPGX2  BRAIN NATRIURETIC PEPTIDE  APTT  PROTIME-INR  HEPARIN LEVEL (UNFRACTIONATED)  CBC  HIV ANTIBODY (ROUTINE TESTING W  REFLEX)  I-STAT BETA HCG BLOOD, ED ([facility], [facility], [facility] ONLY)  TYPE AND SCREEN  PREPARE RBC (CROSSMATCH)  TROPONIN I (HIGH SENSITIVITY)    EKG EKG Interpretation  Date/Time:  Sunday April 05 2021 18:46:56 EDT Ventricular Rate:  108 PR Interval:  136 QRS Duration: 89 QT Interval:  331 QTC Calculation: 444 R Axis:   90 Text Interpretation: Sinus tachycardia Borderline right axis deviation Borderline T wave abnormalities Confirmed by Sherwood Gambler [phone]) on 04/05/2021 6:49:29 PM  Radiology CT Angio Chest PE W and/or Wo Contrast  Result Date: 04/05/2021 CLINICAL DATA:  Shortness of breath EXAM: CT ANGIOGRAPHY CHEST WITH CONTRAST TECHNIQUE: Multidetector CT imaging of the chest was performed using the standard protocol during bolus administration of intravenous contrast. Multiplanar CT image reconstructions and MIPs were obtained to evaluate the vascular anatomy. CONTRAST:  42m OMNIPAQUE IOHEXOL 350 MG/ML SOLN COMPARISON:  None. FINDINGS: Cardiovascular: There is a linear filling defect noted within the main left pulmonary artery, nonocclusive. Similar web like filling defect is seen within the right lower lobe pulmonary artery. These appear to be webs that may be related to chronic pulmonary emboli. Filling defect is seen within a right lower lobe pulmonary arterial branch concerning for acute pulmonary embolus. No evidence of right heart strain. Heart is normal size. Aorta normal caliber. Mediastinum/Nodes: No mediastinal, hilar, or axillary adenopathy. Trachea and esophagus are unremarkable. Thyroid unremarkable. Lungs/Pleura: Lungs are clear. No focal airspace opacities or suspicious nodules. No effusions. Upper Abdomen: Imaging into the upper abdomen demonstrates no acute findings. Musculoskeletal: Chest wall soft tissues are unremarkable. No acute bony abnormality. Review of the MIP images confirms the above findings. IMPRESSION: Small acute appearing right lower lobe pulmonary embolus. Web-like  filling defects in the central pulmonary arteries bilaterally may reflect webs from chronic pulmonary emboli. Electronically Signed   By: KRolm BaptiseM.D.   On: 04/05/2021 21:19   DG Chest Portable 1 View  Result Date: 04/05/2021 CLINICAL DATA:  Acute dyspnea. EXAM: PORTABLE CHEST 1 VIEW COMPARISON:  04/10/2015 FINDINGS: Low lung volumes.The cardiomediastinal contours are normal. Pulmonary vasculature is normal. No consolidation, pleural effusion, or pneumothorax. No acute osseous abnormalities are seen. IMPRESSION: Low lung volumes without acute abnormality. Electronically Signed   By: MKeith RakeM.D.   On: 04/05/2021 19:30    Procedures Ultrasound ED Echo  Date/Time: 04/05/2021 7:22 PM Performed by: GSherwood Gambler MD Authorized by: GSherwood Gambler MD   Procedure details:    Indications: chest pain and dyspnea     Views: subxiphoid and parasternal long axis view     Images: archived     Limitations:  Acoustic shadowing and body habitus Findings:    Pericardium: no pericardial effusion     Cardiac Activity: hyperdynamic     LV Function: normal (>50% EF)   .Critical Care  Date/Time: 04/05/2021 11:44 PM Performed by: GSherwood Gambler MD Authorized by: GSherwood Gambler MD   Critical care provider statement:    Critical care time (minutes):  45   Critical care time was exclusive of:  Separately billable procedures and treating other patients   Critical care was necessary to treat or prevent imminent or life-threatening deterioration of the following conditions:  Circulatory failure and respiratory failure   Critical care was time spent personally by me on the following activities:  Discussions with consultants, evaluation of patient's response to treatment, examination of patient, ordering and performing treatments and interventions, ordering and review of laboratory studies, ordering and review  of radiographic studies, pulse oximetry, re-evaluation of patient's condition, obtaining  history from patient or surrogate and review of old charts   Medications Ordered in ED Medications  0.9 %  sodium chloride infusion (has no administration in time range)  heparin ADULT infusion 100 units/mL (25000 units/280m) (950 Units/hr Intravenous New Bag/Given 04/05/21 2211)  acetaminophen (TYLENOL) tablet 650 mg (has no administration in time range)    Or  acetaminophen (TYLENOL) suppository 650 mg (has no administration in time range)  aspirin chewable tablet 81 mg (has no administration in time range)  fentaNYL (SUBLIMAZE) injection 50 mcg (50 mcg Intravenous Given 04/05/21 1934)  iohexol (OMNIPAQUE) 350 MG/ML injection 50 mL (50 mLs Intravenous Contrast Given 04/05/21 2107)  heparin bolus via infusion 1,500 Units (1,500 Units Intravenous Bolus from Bag 04/05/21 2213)    ED Course  I have reviewed the triage vital signs and the nursing notes.  Pertinent labs & imaging results that were available during my care of the patient were reviewed by me and considered in my medical decision making (see chart for details).    MDM Rules/Calculators/A&P                           High concern for PE based on presentation.  She is stable on the 2 L though mild tachycardia.  Her hemoglobin has come back a little less than 7 which is a recurrent issue for her and she states this is from fibroids causing heavy vaginal bleeding.  Just got off her menstrual cycle.  No current bleeding and no GI bleeding symptoms.  I will give her unit of RBCs.  She was found to have a right lower lobe PE and questionable old PEs and vasculature.  I suspect has never had 1 before.  No RV strain.  Hospitalist to admit. Final Clinical Impression(s) / ED Diagnoses Final diagnoses:  Acute pulmonary embolism, unspecified pulmonary embolism type, unspecified whether acute cor pulmonale present (HCC)  Anemia, unspecified type    Rx / DC Orders ED Discharge Orders     None        GSherwood Gambler MD 04/06/21 0007

## 2021-04-05 NOTE — Progress Notes (Signed)
ANTICOAGULATION CONSULT NOTE - Initial Consult  Pharmacy Consult for heparin Indication: pulmonary embolus  Allergies  Allergen Reactions   Amoxicillin Rash    Has patient had a PCN reaction causing immediate rash, facial/tongue/throat swelling, SOB or lightheadedness with hypotension: No Has patient had a PCN reaction causing severe rash involving mucus membranes or skin necrosis: Yes Has patient had a PCN reaction that required hospitalization No Has patient had a PCN reaction occurring within the last 10 years: No If all of the above answers are "NO", then may proceed with Cephalosporin use.     Patient Measurements: Height: '4\' 11"'$  (149.9 cm) Weight: 68 kg (150 lb) IBW/kg (Calculated) : 43.2 Heparin Dosing Weight: 58.2 kg  Vital Signs: Temp: 98.1 F (36.7 C) (09/04 1847) Temp Source: Oral (09/04 1847) BP: 119/87 (09/04 2015) Pulse Rate: 103 (09/04 2115)  Labs: Recent Labs    04/05/21 1904  HGB 6.6*  HCT 26.1*  PLT 658*  CREATININE 0.60  TROPONINIHS 166*    Estimated Creatinine Clearance: 76.8 mL/min (by C-G formula based on SCr of 0.6 mg/dL).   Medical History: Past Medical History:  Diagnosis Date   Abnormal Pap smear    Anemia    Chlamydia    Gestational diabetes    Trichomoniasis of vagina 2015   Urinary tract infection     Medications: see MAR  Assessment: 40 yo F with acute dyspnea and CP. Trop slightly elev. SOB > on 2L [place] now. CTA with small clot burden PE, no RHS, some chronic PE too per CT read.   PT w/ hx of anemia - Hgb 6.6 today, EDP is transfusing. Has had transfusions in the past, pt aware of anemia.   Goal of Therapy:  Heparin level 0.3-0.5 units/mL > goal is on the lower side given hx of anemia and active transfusion Monitor platelets by anticoagulation protocol: Yes   Plan:   Give 1500 units bolus x 1 Start heparin infusion at 950 units/hr Check anti-Xa level in 8 hours and daily while on heparin Continue to monitor H&H and  platelets, and s/sx of bleeding closely **Pt was given lower overall bolus (~25u/kg) and started on lower end of infusion initial dose given Hgb of 6.6 after talking to EDP.  Joetta Manners, PharmD, [facility] Emergency Medicine Clinical Pharmacist ED RPh Phone: [place]: [phone]

## 2021-04-05 NOTE — ED Triage Notes (Signed)
Pt BIB [facility] for SOB. Pt had a 6 hour long hair appt today, sat the entire time, didn't get up to stand. Pt had sudden onset R calf pain during appointment, stood up, became SOB, had near syncopal episode. SpO2 88% on room air, placed on 5L via [place]. Lungs clear.   EMS VS- HR 102, BP 126/82

## 2021-04-05 NOTE — H&P (Signed)
History and Physical    Sherri Douglas [id number] DOB: 16-Feb-1981 DOA: 04/05/2021  PCP: Rikki Spearing, NP                   Patient coming from: Home.  Chief Complaint: Chest pain and shortness of breath.  HPI: Sherri Douglas is a 40 y.o. 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  Her friend helped her to go to home but when she reached home she was finding it very difficult to walk because of the shortness of breath.  EMS was called and patient was brought to the ER.  Patient chest pressure was retrosternal nonradiating and shortness of breath was worsening with exertion.  Patient does have chronic menorrhagia for which she has been following with OB/GYN.  Her menstrual cycle stopped yesterday.  No active bleeding now.  Denies any nausea vomiting abdominal pain diarrhea fever or chills.  ED Course: In the ER EKG shows sinus tachycardia.  CT angiogram of the chest done shows a small pulmonary embolism with features concerning for chronic pulmonary embolism.  Patient's high sensitive troponin got increased from 166 and it was 814.  Hemoglobin was 6.6 unit of PRBC is being transfused.  WBC count is 14.2 platelets 658.  COVID test is negative.  Patient was started on heparin for the pulm embolism and high sensitive troponin.  Cardiology has been consulted.  Review of Systems: As per HPI, rest all negative.   Past Medical History:  Diagnosis Date   Abnormal Pap smear    Anemia    Chlamydia    Gestational diabetes    Trichomoniasis of vagina 2015   Urinary tract infection     Past Surgical History:  Procedure Laterality Date   INDUCED ABORTION     WISDOM TOOTH EXTRACTION       reports that she has quit smoking. She has never used smokeless tobacco. She reports current alcohol use.  She reports that she does not use drugs.  Allergies  Allergen Reactions   Amoxicillin Rash    Has patient had a PCN reaction causing immediate rash, facial/tongue/throat swelling, SOB or lightheadedness with hypotension: No Has patient had a PCN reaction causing severe rash involving mucus membranes or skin necrosis: Yes Has patient had a PCN reaction that required hospitalization No Has patient had a PCN reaction occurring within the last 10 years: No If all of the above answers are "NO", then may proceed with Cephalosporin use.     Family History  Problem Relation Age of Onset   Diabetes Father    Hypertension Father    Diabetes Paternal Aunt    Hypertension Paternal Aunt    Diabetes Paternal Grandmother    Hypertension Paternal Grandmother    Diabetes Paternal Grandfather    Hypertension Paternal Grandfather    Anesthesia problems Neg Hx     Prior to Admission medications   Medication Sig Start Date End Date Taking? Authorizing Provider  ibuprofen (ADVIL) 200 MG tablet Take 400 mg by mouth every 6 (six) hours as needed for headache or moderate pain.   Yes [provider]  naproxen sodium (ALEVE) 220 MG tablet Take 440 mg by mouth daily as needed (pain).   Yes [provider]  NIFEdipine (PROCARDIA-XL/ADALAT CC) 30 MG 24 hr  tablet Take 1 tablet (30 mg total) by mouth daily. Patient not taking: No sig reported 04/30/16   Thurnell Lose, MD  oxyCODONE-acetaminophen (PERCOCET/ROXICET) 5-325 MG tablet Take 1-2 tablets by mouth every 4 (four) hours as needed for moderate pain or severe pain. Patient not taking: No sig reported 04/30/16   Thurnell Lose, MD    Physical Exam: Constitutional: Moderately built and nourished. Vitals:   04/05/21 2300 04/05/21 2315 04/05/21 2330 04/05/21 2342  BP:    (!) 151/111  Pulse: (!) 105 (!) 111 (!) 115 (!) 102  Resp: 17 (!) 30 (!) 26 (!) 24  Temp:      TempSrc:      SpO2: 100% 100% 100% 98%  Weight:      Height:        Eyes: Anicteric no pallor. ENMT: No discharge from the ears eyes nose and mouth. Neck: No mass felt.  No neck rigidity. Respiratory: No rhonchi or crepitations. Cardiovascular: S1-S2 heard. Abdomen: Soft nontender bowel sound present. Musculoskeletal: No edema. Skin: No rash. Neurologic: Alert awake oriented to time place and person.  Moves all extremities. Psychiatric: Appears normal.  Normal affect.   Labs on Admission: I have personally reviewed following labs and imaging studies  CBC: Recent Labs  Lab 04/05/21 1904  WBC 14.2*  NEUTROABS 11.0*  HGB 6.6*  HCT 26.1*  MCV 69.0*  PLT [id number]*   Basic Metabolic Panel: Recent Labs  Lab 04/05/21 1904  NA 136  K 3.8  CL 106  CO2 21*  GLUCOSE 108*  BUN 9  CREATININE 0.60  CALCIUM 9.1   GFR: Estimated Creatinine Clearance: 76.8 mL/min (by C-G formula based on SCr of 0.6 mg/dL). Liver Function Tests: No results for input(s): AST, ALT, ALKPHOS, BILITOT, PROT, ALBUMIN in the last 168 hours. No results for input(s): LIPASE, AMYLASE in the last 168 hours. No results for input(s): AMMONIA in the last 168 hours. Coagulation Profile: Recent Labs  Lab 04/05/21 2157  INR 1.0   Cardiac Enzymes: No results for input(s): CKTOTAL, CKMB, CKMBINDEX, TROPONINI in the last 168 hours. BNP (last 3 results) No results for input(s): PROBNP in the last 8760 hours. HbA1C: No results for input(s): HGBA1C in the last 72 hours. CBG: No results for input(s): GLUCAP in the last 168 hours. Lipid Profile: No results for input(s): CHOL, HDL, LDLCALC, TRIG, CHOLHDL, LDLDIRECT in the last 72 hours. Thyroid Function Tests: No results for input(s): TSH, T4TOTAL, FREET4, T3FREE, THYROIDAB in the last 72 hours. Anemia Panel: No results for input(s): VITAMINB12, FOLATE, FERRITIN, TIBC, IRON, RETICCTPCT in the last 72 hours. Urine analysis:    Component Value Date/Time   COLORURINE YELLOW 09/08/2015 1023   APPEARANCEUR CLEAR 09/08/2015 1023    LABSPEC 1.015 09/08/2015 1023   PHURINE 6.5 09/08/2015 1023   GLUCOSEU NEGATIVE 09/08/2015 1023   HGBUR NEGATIVE 09/08/2015 1023   BILIRUBINUR NEGATIVE 09/08/2015 1023   KETONESUR NEGATIVE 09/08/2015 1023   PROTEINUR NEGATIVE 09/08/2015 1023   UROBILINOGEN 0.2 03/19/2014 1720   NITRITE NEGATIVE 09/08/2015 1023   LEUKOCYTESUR NEGATIVE 09/08/2015 1023   Sepsis Labs: '@LABRCNTIP'$ (procalcitonin:4,lacticidven:4) ) Recent Results (from the past 240 hour(s))  Resp Panel by RT-PCR (Flu A&B, Covid) Nasopharyngeal Swab     Status: None   Collection Time: 04/05/21  7:06 PM   Specimen: Nasopharyngeal Swab; Nasopharyngeal(NP) swabs in vial transport medium  Result Value Ref Range Status   SARS Coronavirus 2 by RT PCR NEGATIVE NEGATIVE Final    Comment: (NOTE) SARS-CoV-2 target nucleic acids are  NOT DETECTED.  The SARS-CoV-2 RNA is generally detectable in upper respiratory specimens during the acute phase of infection. The lowest concentration of SARS-CoV-2 viral copies this assay can detect is 138 copies/mL. A negative result does not preclude SARS-Cov-2 infection and should not be used as the sole basis for treatment or other patient management decisions. A negative result may occur with  improper specimen collection/handling, submission of specimen other than nasopharyngeal swab, presence of viral mutation(s) within the areas targeted by this assay, and inadequate number of viral copies(<138 copies/mL). A negative result must be combined with clinical observations, patient history, and epidemiological information. The expected result is Negative.  Fact Sheet for Patients:  EntrepreneurPulse.com.au  Fact Sheet for Healthcare Providers:  IncredibleEmployment.be  This test is no t yet approved or cleared by the [place] FDA and  has been authorized for detection and/or diagnosis of SARS-CoV-2 by FDA under an Emergency Use Authorization (EUA). This  EUA will remain  in effect (meaning this test can be used) for the duration of the COVID-19 declaration under Section 564(b)(1) of the Act, 21 U.S.C.section 360bbb-3(b)(1), unless the authorization is terminated  or revoked sooner.       Influenza A by PCR NEGATIVE NEGATIVE Final   Influenza B by PCR NEGATIVE NEGATIVE Final    Comment: (NOTE) The Xpert Xpress SARS-CoV-2/FLU/RSV plus assay is intended as an aid in the diagnosis of influenza from Nasopharyngeal swab specimens and should not be used as a sole basis for treatment. Nasal washings and aspirates are unacceptable for Xpert Xpress SARS-CoV-2/FLU/RSV testing.  Fact Sheet for Patients: EntrepreneurPulse.com.au  Fact Sheet for Healthcare Providers: IncredibleEmployment.be  This test is not yet approved or cleared by the [place] FDA and has been authorized for detection and/or diagnosis of SARS-CoV-2 by FDA under an Emergency Use Authorization (EUA). This EUA will remain in effect (meaning this test can be used) for the duration of the COVID-19 declaration under Section 564(b)(1) of the Act, 21 U.S.C. section 360bbb-3(b)(1), unless the authorization is terminated or revoked.  Performed at [facility], [place] [address], [place], [place] [address]      Radiological Exams on Admission: CT Angio Chest PE W and/or Wo Contrast  Result Date: 04/05/2021 CLINICAL DATA:  Shortness of breath EXAM: CT ANGIOGRAPHY CHEST WITH CONTRAST TECHNIQUE: Multidetector CT imaging of the chest was performed using the standard protocol during bolus administration of intravenous contrast. Multiplanar CT image reconstructions and MIPs were obtained to evaluate the vascular anatomy. CONTRAST:  37m OMNIPAQUE IOHEXOL 350 MG/ML SOLN COMPARISON:  None. FINDINGS: Cardiovascular: There is a linear filling defect noted within the main left pulmonary artery, nonocclusive. Similar web like filling defect is seen  within the right lower lobe pulmonary artery. These appear to be webs that may be related to chronic pulmonary emboli. Filling defect is seen within a right lower lobe pulmonary arterial branch concerning for acute pulmonary embolus. No evidence of right heart strain. Heart is normal size. Aorta normal caliber. Mediastinum/Nodes: No mediastinal, hilar, or axillary adenopathy. Trachea and esophagus are unremarkable. Thyroid unremarkable. Lungs/Pleura: Lungs are clear. No focal airspace opacities or suspicious nodules. No effusions. Upper Abdomen: Imaging into the upper abdomen demonstrates no acute findings. Musculoskeletal: Chest wall soft tissues are unremarkable. No acute bony abnormality. Review of the MIP images confirms the above findings. IMPRESSION: Small acute appearing right lower lobe pulmonary embolus. Web-like filling defects in the central pulmonary arteries bilaterally may reflect webs from chronic pulmonary emboli. Electronically Signed  By: Rolm Baptise M.D.   On: 04/05/2021 21:19   DG Chest Portable 1 View  Result Date: 04/05/2021 CLINICAL DATA:  Acute dyspnea. EXAM: PORTABLE CHEST 1 VIEW COMPARISON:  04/10/2015 FINDINGS: Low lung volumes.The cardiomediastinal contours are normal. Pulmonary vasculature is normal. No consolidation, pleural effusion, or pneumothorax. No acute osseous abnormalities are seen. IMPRESSION: Low lung volumes without acute abnormality. Electronically Signed   By: Keith Rake M.D.   On: 04/05/2021 19:30    EKG: Independently reviewed.  Sinus tachycardia.  Assessment/Plan Principal Problem:   Acute pulmonary embolism (HCC) Active Problems:   Iron deficiency anemia   Menorrhagia   Elevated troponin    Acute pulmonary embolism with CT scan showing features concerning for changes with chronic pulmonary embolism with elevated high sensitive troponin presently on heparin.  Slowly transition to oral anticoagulants once patient is stable. Elevated high  sensitive troponin with increasing trend with chest pain concerning for non-ST elevation MI.  I have consulted cardiology.  I placed patient on aspirin patient is only on heparin.  If blood pressure allows we will keep patient on beta-blockers.  Start statins.  We will keep patient NPO.  Check 2D echo.  Patient was given sublingual nitroglycerin with chest pain not improving. Microcytic hypochromic anemia likely from menorrhagia likely contributing to patient's symptoms.  Patient is receiving 1 unit of PRBC transfusion.  Follow CBC.  May need more units of transfusion.  Patient has follow-up with her OB/GYN. Tobacco abuse advised about quitting.  Since patient has worsening elevated high sensitive troponins with the shortness of breath chest pain and pulm embolism and worsening anemia will need close monitoring and further work-up and inpatient status.   DVT prophylaxis: Heparin infusion. Code Status: Full code. Family Communication: Discussed with patient. Disposition Plan: Home. Consults called: Cardiology. Admission status: Inpatient.   Rise Patience MD [facility] Pager [phone].  If 7PM-7AM, please contact night-coverage [email or web address] Password [email or web address]  04/05/2021, 11:50 PM

## 2021-04-06 ENCOUNTER — Inpatient Hospital Stay (HOSPITAL_COMMUNITY): Payer: BC Managed Care – PPO

## 2021-04-06 DIAGNOSIS — I2699 Other pulmonary embolism without acute cor pulmonale: Secondary | ICD-10-CM

## 2021-04-06 DIAGNOSIS — R0602 Shortness of breath: Secondary | ICD-10-CM

## 2021-04-06 DIAGNOSIS — N920 Excessive and frequent menstruation with regular cycle: Secondary | ICD-10-CM

## 2021-04-06 DIAGNOSIS — R079 Chest pain, unspecified: Secondary | ICD-10-CM

## 2021-04-06 DIAGNOSIS — I214 Non-ST elevation (NSTEMI) myocardial infarction: Secondary | ICD-10-CM

## 2021-04-06 DIAGNOSIS — D649 Anemia, unspecified: Secondary | ICD-10-CM

## 2021-04-06 LAB — PREPARE RBC (CROSSMATCH)

## 2021-04-06 LAB — HEPATIC FUNCTION PANEL
ALT: 12 U/L (ref 0–44)
AST: 17 U/L (ref 15–41)
Albumin: 3.2 g/dL — ABNORMAL LOW (ref 3.5–5.0)
Alkaline Phosphatase: 47 U/L (ref 38–126)
Bilirubin, Direct: <0.1 mg/dL (ref 0.0–0.2)
Total Bilirubin: 0.8 mg/dL (ref 0.3–1.2)
Total Protein: 6.4 g/dL — ABNORMAL LOW (ref 6.5–8.1)

## 2021-04-06 LAB — HEPARIN LEVEL (UNFRACTIONATED)
Heparin Unfractionated: 0.28 IU/mL — ABNORMAL LOW (ref 0.30–0.70)
Heparin Unfractionated: 0.12 IU/mL — ABNORMAL LOW (ref 0.30–0.70)
Heparin Unfractionated: 0.37 IU/mL (ref 0.30–0.70)

## 2021-04-06 LAB — BASIC METABOLIC PANEL
Anion gap: 7 (ref 5–15)
BUN: 9 mg/dL (ref 6–20)
CO2: 22 mmol/L (ref 22–32)
Calcium: 8.9 mg/dL (ref 8.9–10.3)
Chloride: 105 mmol/L (ref 98–111)
Creatinine, Ser: 0.58 mg/dL (ref 0.44–1.00)
GFR, Estimated: >60 mL/min (ref >60)
Glucose, Bld: 105 mg/dL — ABNORMAL HIGH (ref 70–99)
Potassium: 3.6 mmol/L (ref 3.5–5.1)
Sodium: 134 mmol/L — ABNORMAL LOW (ref 135–145)

## 2021-04-06 LAB — CBC WITH DIFFERENTIAL/PLATELET
Abs Immature Granulocytes: 0.05 10*3/uL (ref 0.00–0.07)
Basophils Absolute: 0.1 10*3/uL (ref 0.0–0.1)
Basophils Relative: 0 %
Eosinophils Absolute: 0 10*3/uL (ref 0.0–0.5)
Eosinophils Relative: 0 %
HCT: 28.2 % — ABNORMAL LOW (ref 36.0–46.0)
Hemoglobin: 8 g/dL — ABNORMAL LOW (ref 12.0–15.0)
Immature Granulocytes: 0 %
Lymphocytes Relative: 24 %
Lymphs Abs: 2.8 10*3/uL (ref 0.7–4.0)
MCH: 20 pg — ABNORMAL LOW (ref 26.0–34.0)
MCHC: 28.4 g/dL — ABNORMAL LOW (ref 30.0–36.0)
MCV: 70.5 fL — ABNORMAL LOW (ref 80.0–100.0)
Monocytes Absolute: 0.6 10*3/uL (ref 0.1–1.0)
Monocytes Relative: 6 %
Neutro Abs: 8 10*3/uL — ABNORMAL HIGH (ref 1.7–7.7)
Neutrophils Relative %: 70 %
Platelets: 567 10*3/uL — ABNORMAL HIGH (ref 150–400)
RBC: 4 MIL/uL (ref 3.87–5.11)
RDW: 24.3 % — ABNORMAL HIGH (ref 11.5–15.5)
WBC: 11.6 10*3/uL — ABNORMAL HIGH (ref 4.0–10.5)
nRBC: 0.5 % — ABNORMAL HIGH (ref 0.0–0.2)

## 2021-04-06 LAB — CBC
HCT: 27.7 % — ABNORMAL LOW (ref 36.0–46.0)
HCT: 32.5 % — ABNORMAL LOW (ref 36.0–46.0)
Hemoglobin: 7.7 g/dL — ABNORMAL LOW (ref 12.0–15.0)
Hemoglobin: 9.3 g/dL — ABNORMAL LOW (ref 12.0–15.0)
MCH: 19.7 pg — ABNORMAL LOW (ref 26.0–34.0)
MCH: 20.5 pg — ABNORMAL LOW (ref 26.0–34.0)
MCHC: 27.8 g/dL — ABNORMAL LOW (ref 30.0–36.0)
MCHC: 28.6 g/dL — ABNORMAL LOW (ref 30.0–36.0)
MCV: 70.8 fL — ABNORMAL LOW (ref 80.0–100.0)
MCV: 71.6 fL — ABNORMAL LOW (ref 80.0–100.0)
Platelets: 577 10*3/uL — ABNORMAL HIGH (ref 150–400)
Platelets: 551 10*3/uL — ABNORMAL HIGH (ref 150–400)
RBC: 3.91 MIL/uL (ref 3.87–5.11)
RBC: 4.54 MIL/uL (ref 3.87–5.11)
RDW: 24.4 % — ABNORMAL HIGH (ref 11.5–15.5)
RDW: 23.3 % — ABNORMAL HIGH (ref 11.5–15.5)
WBC: 11.8 10*3/uL — ABNORMAL HIGH (ref 4.0–10.5)
WBC: 11.7 10*3/uL — ABNORMAL HIGH (ref 4.0–10.5)
nRBC: 0.4 % — ABNORMAL HIGH (ref 0.0–0.2)
nRBC: 1 % — ABNORMAL HIGH (ref 0.0–0.2)

## 2021-04-06 LAB — TROPONIN I (HIGH SENSITIVITY)
Troponin I (High Sensitivity): 1023 ng/L (ref <18)
Troponin I (High Sensitivity): 831 ng/L (ref <18)

## 2021-04-06 LAB — ECHOCARDIOGRAM COMPLETE
AR max vel: 1.95 cm2
AV Area VTI: 1.74 cm2
AV Area mean vel: 1.7 cm2
AV Mean grad: 4 mmHg
AV Peak grad: 7 mmHg
Ao pk vel: 1.32 m/s
Area-P 1/2: 4.36 cm2
Height: 59 in
S' Lateral: 2.3 cm
Weight: 2408 oz

## 2021-04-06 LAB — HIV ANTIBODY (ROUTINE TESTING W REFLEX): HIV Screen 4th Generation wRfx: NONREACTIVE

## 2021-04-06 MED ORDER — ALUM & MAG HYDROXIDE-SIMETH 200-200-20 MG/5ML PO SUSP: 15.0000 mL | ORAL | Status: DC | PRN

## 2021-04-06 MED ORDER — SODIUM CHLORIDE 0.9% IV SOLUTION: Freq: Once | INTRAVENOUS | Status: AC

## 2021-04-06 MED ORDER — ORAL CARE MOUTH RINSE
15.0000 mL | Freq: Two times a day (BID) | OROMUCOSAL | Status: DC
  Administered 2021-04-06 – 2021-04-07 (×4): 15 mL via OROMUCOSAL

## 2021-04-06 MED ORDER — NITROGLYCERIN 0.4 MG SL SUBL
SUBLINGUAL_TABLET | SUBLINGUAL | Status: AC
  Administered 2021-04-06: 0.4 mg via SUBLINGUAL
  Filled 2021-04-06: qty 1

## 2021-04-06 MED ORDER — PERFLUTREN LIPID MICROSPHERE
1.0000 mL | INTRAVENOUS | Status: AC | PRN
  Administered 2021-04-06: 2 mL via INTRAVENOUS
  Filled 2021-04-06: qty 10

## 2021-04-06 MED ORDER — ALUM & MAG HYDROXIDE-SIMETH 200-200-20 MG/5ML PO SUSP
30.0000 mL | Freq: Once | ORAL | Status: AC
  Administered 2021-04-06: 30 mL via ORAL
  Filled 2021-04-06: qty 30

## 2021-04-06 MED ORDER — ATORVASTATIN CALCIUM 80 MG PO TABS
80.0000 mg | ORAL_TABLET | Freq: Every day | ORAL | Status: DC
  Administered 2021-04-06 – 2021-04-07 (×2): 80 mg via ORAL
  Filled 2021-04-06 (×3): qty 1

## 2021-04-06 MED ORDER — PANTOPRAZOLE SODIUM 40 MG PO TBEC
40.0000 mg | DELAYED_RELEASE_TABLET | Freq: Two times a day (BID) | ORAL | Status: DC
  Administered 2021-04-06 – 2021-04-07 (×3): 40 mg via ORAL
  Filled 2021-04-06 (×3): qty 1

## 2021-04-06 MED ORDER — ZOLPIDEM TARTRATE 5 MG PO TABS
5.0000 mg | ORAL_TABLET | Freq: Every evening | ORAL | Status: DC | PRN
  Administered 2021-04-06 (×2): 5 mg via ORAL
  Filled 2021-04-06 (×2): qty 1

## 2021-04-06 MED ORDER — NITROGLYCERIN 0.4 MG SL SUBL: 0.4000 mg | SUBLINGUAL_TABLET | SUBLINGUAL | Status: DC | PRN

## 2021-04-06 MED ORDER — HEPARIN BOLUS VIA INFUSION
1200.0000 [IU] | Freq: Once | INTRAVENOUS | Status: AC
  Administered 2021-04-06: 1200 [IU] via INTRAVENOUS
  Filled 2021-04-06: qty 1200

## 2021-04-06 NOTE — Progress Notes (Signed)
ANTICOAGULATION CONSULT NOTE - Follow Up Consult  Pharmacy Consult for heparin Indication: pulmonary embolus  Allergies  Allergen Reactions   Amoxicillin Rash    Has patient had a PCN reaction causing immediate rash, facial/tongue/throat swelling, SOB or lightheadedness with hypotension: No Has patient had a PCN reaction causing severe rash involving mucus membranes or skin necrosis: Yes Has patient had a PCN reaction that required hospitalization No Has patient had a PCN reaction occurring within the last 10 years: No If all of the above answers are "NO", then may proceed with Cephalosporin use.     Patient Measurements: Height: '4\' 11"'$  (149.9 cm) Weight: 68.3 kg (150 lb 8 oz) IBW/kg (Calculated) : 43.2 Heparin Dosing Weight: 58.2kg  Vital Signs: Temp: 99.1 F (37.3 C) (09/05 1330) Temp Source: Oral (09/05 1330) BP: 156/104 (09/05 1330) Pulse Rate: 100 (09/05 1330)  Labs: Recent Labs    04/05/21 1904 04/05/21 2104 04/05/21 2157 04/06/21 0051 04/06/21 0601 04/06/21 1406  HGB 6.6*  --   --   --  8.0*  7.7*  --   HCT 26.1*  --   --   --  28.2*  27.7*  --   PLT 658*  --   --   --  567*  577*  --   APTT  --   --  24  --   --   --   LABPROT  --   --  12.8  --   --   --   INR  --   --  1.0  --   --   --   HEPARINUNFRC  --   --   --   --  0.28* 0.12*  CREATININE 0.60  --   --   --  0.58  --   TROPONINIHS 166* 814*  --  1,023* 831*  --      Estimated Creatinine Clearance: 76.9 mL/min (by C-G formula based on SCr of 0.58 mg/dL).   Medications:  Infusions:   sodium chloride     heparin 1,000 Units/hr (04/06/21 0739)    Assessment: 40 yo female presented with acute chest pain and found to have a small PE on CTA in the ED. Of note, patient has history of heavy menstrual periods and anemia (last period ended Friday 9/2). No anticoagulation PTA. Pharmacy consulted on heparin dosing for PE.  Heparin level still subtherapeutic at 0.12 after slight rate increase to 1000  units/hr for a slightly subtherapeutic level this morning (0.28).  Hgb on admission was low (6.9, baseline 11-12s) but improved to 8.0 after 2 units of pRBC. Plt still elevated at 567 (575 on admission, baseline ~200s). No s/sx of bleeding or infusion problems noted per nursing.  Goal of Therapy:  Heparin level 0.3-0.5 units/mL > goal is on the lower side given hx of anemia and active transfusion Monitor platelets by anticoagulation protocol: Yes   Plan:  Rebolus 1200 units x1 Increase heparin infusion to 1150 units/hr 6 hour heparin level Daily heparin level and CBC Monitor for signs/symptoms of bleeding  Laurey Arrow, PharmD PGY1 Pharmacy Resident 04/06/2021  3:11 PM  Please check [email or web address] for unit-specific pharmacy phone numbers.

## 2021-04-06 NOTE — Progress Notes (Signed)
PROGRESS NOTE  Hayes Carruthers [id number] DOB: [birth year] DOA: 04/05/2021 PCP: Rikki Spearing, NP   LOS: 1 day   Brief narrative:  Sherri Douglas is a 40 y.o. 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  Patient had difficulty ambulating.  EMS was called in and patient was brought into the hospital.  In the ED, patient was noted to have sinus tachycardia.  CT angiogram of the chest showed small pulmonary embolism.  Troponins were elevated as well.  Hemoglobin was noted to be 6.6 so 1 unit of packed RBC was transfused.  Patient was then admitted to hospital for further evaluation and treatment.    Assessment/Plan:  Principal Problem:   Acute pulmonary embolism (HCC) Active Problems:   Iron deficiency anemia   Menorrhagia   Elevated troponin  Acute pulmonary embolism  CTA chest chest showed features consistent with chronic pulmonary embolism.  On IV heparin.  Scan showing features concerning for changes with chronic pulmonary embolism with elevated high sensitive troponin presently on heparin.  Hemodynamically stable tachycardia has improved.  Blood pressure on the higher side.  Change to oral anticoagulants when okay with cardiology.  Elevated high sensitive troponin  Increasing trend with chest pain concerning for non-ST elevation MI.  Cardiology has been consulted.  On IV heparin drip and aspirin.  Continue beta-blockers.  Continue statins.  Check 2D echocardiogram as per cardiology.  Might need coronary CTA if patient continues to have chest pain despite blood transfusion.  No chest pain at the time of my evaluation.  Continue as needed sublingual nitroglycerin   Microcytic hypochromic anemia likely from menorrhagia likely contributing to patient's symptoms.  Patient is receiving 1 unit of PRBC transfusion.  Follow CBC.  May need more units of transfusion.  Patient has follow-up with her  OB/GYN.  Tobacco abuse Counseled about quitting.  DVT prophylaxis:  heparin gtt  Code Status:  full    Family Communication: Full code   Status is: Inpatient  Remains inpatient appropriate because:IV treatments appropriate due to intensity of illness or inability to take PO and Inpatient level of care appropriate due to severity of illness  Dispo: The patient is from: Home              Anticipated d/c is to: Home              Patient currently is not medically stable to d/c.   Difficult to place patient No  Consultants: none  Procedures: Transfusion of packed RBC  Anti-infectives:  None  Anti-infectives (From admission, onward)    None      Subjective: Today, patient was seen and examined at bedside. No chest pain and shortness breath.  Objective: Vitals:   04/06/21 1034 04/06/21 1129  BP: (!) 154/111 (!) 145/110  Pulse: 96 94  Resp: 16 17  Temp: 99 F (37.2 C) 99 F (37.2 C)  SpO2: 97% 96%    Intake/Output Summary (Last 24 hours) at 04/06/2021 1224 Last data filed at 04/06/2021 1000 Gross per 24 hour  Intake 1094.08 ml  Output 600 ml  Net 494.08 ml   Filed Weights   04/05/21 1848 04/06/21 0133  Weight: 68 kg 68.3 kg   Body mass index is 30.4 kg/m.   Physical Exam: GENERAL: Patient is alert awake and oriented. Not in obvious distress.  Obese HENT: No scleral pallor or icterus. Pupils equally reactive to light. Oral mucosa is  moist NECK: is supple, no gross swelling noted. CHEST: Clear to auscultation. No crackles or wheezes.  Diminished breath sounds bilaterally. CVS: S1 and S2 heard, no murmur. Regular rate and rhythm.  ABDOMEN: Soft, non-tender, bowel sounds are present. EXTREMITIES: No edema. CNS: Cranial nerves are intact. No focal motor deficits. SKIN: warm and dry without rashes.  Data Review: I have personally reviewed the following laboratory data and studies,  CBC: Recent Labs  Lab 04/05/21 1904 04/06/21 0601  WBC 14.2* 11.6*   11.8*  NEUTROABS 11.0* 8.0*  HGB 6.6* 8.0*  7.7*  HCT 26.1* 28.2*  27.7*  MCV 69.0* 70.5*  70.8*  PLT 658* 567*  [id number]*   Basic Metabolic Panel: Recent Labs  Lab 04/05/21 1904 04/06/21 0601  NA 136 134*  K 3.8 3.6  CL 106 105  CO2 21* 22  GLUCOSE 108* 105*  BUN 9 9  CREATININE 0.60 0.58  CALCIUM 9.1 8.9   Liver Function Tests: Recent Labs  Lab 04/06/21 0601  AST 17  ALT 12  ALKPHOS 47  BILITOT 0.8  PROT 6.4*  ALBUMIN 3.2*   No results for input(s): LIPASE, AMYLASE in the last 168 hours. No results for input(s): AMMONIA in the last 168 hours. Cardiac Enzymes: No results for input(s): CKTOTAL, CKMB, CKMBINDEX, TROPONINI in the last 168 hours. BNP (last 3 results) Recent Labs    04/05/21 1904  BNP 43.5    ProBNP (last 3 results) No results for input(s): PROBNP in the last 8760 hours.  CBG: No results for input(s): GLUCAP in the last 168 hours. Recent Results (from the past 240 hour(s))  Resp Panel by RT-PCR (Flu A&B, Covid) Nasopharyngeal Swab     Status: None   Collection Time: 04/05/21  7:06 PM   Specimen: Nasopharyngeal Swab; Nasopharyngeal(NP) swabs in vial transport medium  Result Value Ref Range Status   SARS Coronavirus 2 by RT PCR NEGATIVE NEGATIVE Final    Comment: (NOTE) SARS-CoV-2 target nucleic acids are NOT DETECTED.  The SARS-CoV-2 RNA is generally detectable in upper respiratory specimens during the acute phase of infection. The lowest concentration of SARS-CoV-2 viral copies this assay can detect is 138 copies/mL. A negative result does not preclude SARS-Cov-2 infection and should not be used as the sole basis for treatment or other patient management decisions. A negative result may occur with  improper specimen collection/handling, submission of specimen other than nasopharyngeal swab, presence of viral mutation(s) within the areas targeted by this assay, and inadequate number of viral copies(<138 copies/mL). A negative result must be  combined with clinical observations, patient history, and epidemiological information. The expected result is Negative.  Fact Sheet for Patients:  EntrepreneurPulse.com.au  Fact Sheet for Healthcare Providers:  IncredibleEmployment.be  This test is no t yet approved or cleared by the [place] FDA and  has been authorized for detection and/or diagnosis of SARS-CoV-2 by FDA under an Emergency Use Authorization (EUA). This EUA will remain  in effect (meaning this test can be used) for the duration of the COVID-19 declaration under Section 564(b)(1) of the Act, 21 U.S.C.section 360bbb-3(b)(1), unless the authorization is terminated  or revoked sooner.       Influenza A by PCR NEGATIVE NEGATIVE Final   Influenza B by PCR NEGATIVE NEGATIVE Final    Comment: (NOTE) The Xpert Xpress SARS-CoV-2/FLU/RSV plus assay is intended as an aid in the diagnosis of influenza from Nasopharyngeal swab specimens and should not be used as a sole basis for treatment. Nasal washings and  aspirates are unacceptable for Xpert Xpress SARS-CoV-2/FLU/RSV testing.  Fact Sheet for Patients: EntrepreneurPulse.com.au  Fact Sheet for Healthcare Providers: IncredibleEmployment.be  This test is not yet approved or cleared by the [place] FDA and has been authorized for detection and/or diagnosis of SARS-CoV-2 by FDA under an Emergency Use Authorization (EUA). This EUA will remain in effect (meaning this test can be used) for the duration of the COVID-19 declaration under Section 564(b)(1) of the Act, 21 U.S.C. section 360bbb-3(b)(1), unless the authorization is terminated or revoked.  Performed at [facility], [place] [address], [place], [place] [address]      Studies: CT Angio Chest PE W and/or Wo Contrast  Result Date: 04/05/2021 CLINICAL DATA:  Shortness of breath EXAM: CT ANGIOGRAPHY CHEST WITH CONTRAST TECHNIQUE:  Multidetector CT imaging of the chest was performed using the standard protocol during bolus administration of intravenous contrast. Multiplanar CT image reconstructions and MIPs were obtained to evaluate the vascular anatomy. CONTRAST:  32m OMNIPAQUE IOHEXOL 350 MG/ML SOLN COMPARISON:  None. FINDINGS: Cardiovascular: There is a linear filling defect noted within the main left pulmonary artery, nonocclusive. Similar web like filling defect is seen within the right lower lobe pulmonary artery. These appear to be webs that may be related to chronic pulmonary emboli. Filling defect is seen within a right lower lobe pulmonary arterial branch concerning for acute pulmonary embolus. No evidence of right heart strain. Heart is normal size. Aorta normal caliber. Mediastinum/Nodes: No mediastinal, hilar, or axillary adenopathy. Trachea and esophagus are unremarkable. Thyroid unremarkable. Lungs/Pleura: Lungs are clear. No focal airspace opacities or suspicious nodules. No effusions. Upper Abdomen: Imaging into the upper abdomen demonstrates no acute findings. Musculoskeletal: Chest wall soft tissues are unremarkable. No acute bony abnormality. Review of the MIP images confirms the above findings. IMPRESSION: Small acute appearing right lower lobe pulmonary embolus. Web-like filling defects in the central pulmonary arteries bilaterally may reflect webs from chronic pulmonary emboli. Electronically Signed   By: KRolm BaptiseM.D.   On: 04/05/2021 21:19   DG Chest Portable 1 View  Result Date: 04/05/2021 CLINICAL DATA:  Acute dyspnea. EXAM: PORTABLE CHEST 1 VIEW COMPARISON:  04/10/2015 FINDINGS: Low lung volumes.The cardiomediastinal contours are normal. Pulmonary vasculature is normal. No consolidation, pleural effusion, or pneumothorax. No acute osseous abnormalities are seen. IMPRESSION: Low lung volumes without acute abnormality. Electronically Signed   By: MKeith RakeM.D.   On: 04/05/2021 19:30   VAS [place]OWER  EXTREMITY VENOUS (DVT)  Result Date: 04/06/2021  Lower Venous DVT Study Patient Name:  ACARMELITA PARISIAN Date of Exam:   04/06/2021 Medical Rec #: [id number]    Accession #:    [id number] of Birth: 7Oct 13, 1982    Patient Gender: F Patient Age:   89years [facility]      V[facility] [place]OWER EXTREMITY VENOUS (DVT) Referring Phys: AGean Birchwood--------------------------------------------------------------------------------  Indications: Pulmonary embolism.  Comparison Study: no prior Performing Technologist: MArchie PattenRVS  Examination Guidelines: A complete evaluation includes B-mode imaging, spectral Doppler, color Doppler, and power Doppler as needed of all accessible portions of each vessel. Bilateral testing is considered an integral part of a complete examination. Limited examinations for reoccurring indications may be performed as noted. The reflux portion of the exam is performed with the patient in reverse Trendelenburg.  +---------+---------------+---------+-----------+----------+--------------+ RIGHT    CompressibilityPhasicitySpontaneityPropertiesThrombus Aging +---------+---------------+---------+-----------+----------+--------------+ CFV      Full  Yes      Yes                                 +---------+---------------+---------+-----------+----------+--------------+ SFJ      Full                                                        +---------+---------------+---------+-----------+----------+--------------+ FV Prox  Full                                                        +---------+---------------+---------+-----------+----------+--------------+ FV Mid   Full                                                        +---------+---------------+---------+-----------+----------+--------------+ FV DistalFull                                                         +---------+---------------+---------+-----------+----------+--------------+ PFV      Full                                                        +---------+---------------+---------+-----------+----------+--------------+ POP      Full           Yes      Yes                                 +---------+---------------+---------+-----------+----------+--------------+ PTV      Full                                                        +---------+---------------+---------+-----------+----------+--------------+ PERO     Full                                                        +---------+---------------+---------+-----------+----------+--------------+   +---------+---------------+---------+-----------+----------+--------------+ LEFT     CompressibilityPhasicitySpontaneityPropertiesThrombus Aging +---------+---------------+---------+-----------+----------+--------------+ CFV      Full           Yes      Yes                                 +---------+---------------+---------+-----------+----------+--------------+ SFJ  Full                                                        +---------+---------------+---------+-----------+----------+--------------+ FV Prox  Full                                                        +---------+---------------+---------+-----------+----------+--------------+ FV Mid   Full                                                        +---------+---------------+---------+-----------+----------+--------------+ FV DistalFull                                                        +---------+---------------+---------+-----------+----------+--------------+ PFV      Full                                                        +---------+---------------+---------+-----------+----------+--------------+ POP      Full           Yes      Yes                                  +---------+---------------+---------+-----------+----------+--------------+ PTV      Full                                                        +---------+---------------+---------+-----------+----------+--------------+ PERO     Full                                                        +---------+---------------+---------+-----------+----------+--------------+     Summary: BILATERAL: - No evidence of deep vein thrombosis seen in the lower extremities, bilaterally. -No evidence of popliteal cyst, bilaterally.   *See table(s) above for measurements and observations.    Preliminary       Flora Lipps, MD  [facility] 04/06/2021  If 7PM-7AM, please contact night-coverage

## 2021-04-06 NOTE — Consult Note (Signed)
Cardiology Consultation:   Patient ID: Sherri Douglas MRN: [id number]; DOB: 1981/03/12  Admit date: 04/05/2021 Date of Consult: 04/06/2021  Primary Care Provider: Rikki Spearing, NP Primary Cardiologist: None  Primary Electrophysiologist:  None    Patient Profile:   Sherri Douglas is a 40 y.o. female with a hx of fibroids with heavy menstraul bleeding, anemia who is being seen today for the evaluation of chest pain at the request of hospital medicine.  History of Present Illness:   Sherri Douglas is 1 with fibroids, heavy menstrual periods, anemia who presents today with acute onset chest pain. Notes that she was in her usual state of health and went today to get her hair done. Sat for 6 hours. Noted some ongoing pain in her right lower extremity but otherwise was okay.   After that, she had acute onset chest pain/pressure that was ongoing. The pain persisted so she came to the ED for evaluation. No other symptoms besides SOB. Notes that she has been having heavy menstrual cycle that just ended yesterday. This is a chronic issue.   In the ED, she was found to have PE both chronic and acute. Troponin elevated and rising (166->814->1000s)   Past Medical History:  Diagnosis Date   Abnormal Pap smear    Anemia    Chlamydia    Gestational diabetes    Trichomoniasis of vagina 2015   Urinary tract infection     Past Surgical History:  Procedure Laterality Date   INDUCED ABORTION     WISDOM TOOTH EXTRACTION       Home Medications:  Prior to Admission medications   Medication Sig Start Date End Date Taking? Authorizing Provider  ibuprofen (ADVIL) 200 MG tablet Take 400 mg by mouth every 6 (six) hours as needed for headache or moderate pain.   Yes [provider]  naproxen sodium (ALEVE) 220 MG tablet Take 440 mg by mouth daily as needed (pain).   Yes [provider]  NIFEdipine (PROCARDIA-XL/ADALAT CC) 30 MG 24 hr tablet Take 1 tablet (30 mg total) by mouth  daily. Patient not taking: No sig reported 04/30/16   Thurnell Lose, MD  oxyCODONE-acetaminophen (PERCOCET/ROXICET) 5-325 MG tablet Take 1-2 tablets by mouth every 4 (four) hours as needed for moderate pain or severe pain. Patient not taking: No sig reported 04/30/16   Thurnell Lose, MD    Inpatient Medications: Scheduled Meds:  aspirin  81 mg Oral Daily   Continuous Infusions:  sodium chloride     heparin 950 Units/hr (04/05/21 2211)   PRN Meds: acetaminophen **OR** acetaminophen, nitroGLYCERIN  Allergies:    Allergies  Allergen Reactions   Amoxicillin Rash    Has patient had a PCN reaction causing immediate rash, facial/tongue/throat swelling, SOB or lightheadedness with hypotension: No Has patient had a PCN reaction causing severe rash involving mucus membranes or skin necrosis: Yes Has patient had a PCN reaction that required hospitalization No Has patient had a PCN reaction occurring within the last 10 years: No If all of the above answers are "NO", then may proceed with Cephalosporin use.     Social History:   Social History   Socioeconomic History   Marital status: Single    Spouse name: Not on file   Number of children: Not on file   Years of education: Not on file   Highest education level: Not on file  Occupational History   Not on file  Tobacco Use   Smoking status: Former   Smokeless tobacco: Never  Substance and Sexual Activity   Alcohol use: Yes    Comment: socially   Drug use: No   Sexual activity: Yes    Birth control/protection: None    Comment: academic advisor  Other Topics Concern   Not on file  Social History Narrative   Not on file   Social Determinants of Health   Financial Resource Strain: Not on file  Food Insecurity: Not on file  Transportation Needs: Not on file  Physical Activity: Not on file  Stress: Not on file  Social Connections: Not on file  Intimate Partner Violence: Not on file    Family History:   Family History   Problem Relation Age of Onset   Diabetes Father    Hypertension Father    Diabetes Paternal Aunt    Hypertension Paternal Aunt    Diabetes Paternal Grandmother    Hypertension Paternal Grandmother    Diabetes Paternal Grandfather    Hypertension Paternal Grandfather    Anesthesia problems Neg Hx      Review of Systems: [y] = yes, '[ ]'$  = no    General: Weight gain '[ ]'$ ; Weight loss '[ ]'$ ; Anorexia '[ ]'$ ; Fatigue '[ ]'$ ; Fever '[ ]'$ ; Chills '[ ]'$ ; Weakness '[ ]'$   Cardiac: Chest pain/pressure Blue.Reese ]; Resting SOB '[ ]'$ ; Exertional SOB [ y]; Orthopnea '[ ]'$ ; Pedal Edema '[ ]'$ ; Palpitations '[ ]'$ ; Syncope '[ ]'$ ; Presyncope '[ ]'$ ; Paroxysmal nocturnal dyspnea'[ ]'$   Pulmonary: Cough '[ ]'$ ; Wheezing'[ ]'$ ; Hemoptysis'[ ]'$ ; Sputum '[ ]'$ ; Snoring '[ ]'$   GI: Vomiting'[ ]'$ ; Dysphagia'[ ]'$ ; Melena'[ ]'$ ; Hematochezia '[ ]'$ ; Heartburn'[ ]'$ ; Abdominal pain '[ ]'$ ; Constipation '[ ]'$ ; Diarrhea '[ ]'$ ; BRBPR '[ ]'$   GU: Hematuria'[ ]'$ ; Dysuria '[ ]'$ ; Nocturia'[ ]'$   Vascular: Pain in legs with walking '[ ]'$ ; Pain in feet with lying flat '[ ]'$ ; Non-healing sores '[ ]'$ ; Stroke '[ ]'$ ; TIA '[ ]'$ ; Slurred speech '[ ]'$ ;  Neuro: Headaches'[ ]'$ ; Vertigo'[ ]'$ ; Seizures'[ ]'$ ; Paresthesias'[ ]'$ ;Blurred vision '[ ]'$ ; Diplopia '[ ]'$ ; Vision changes '[ ]'$   Ortho/Skin: Arthritis '[ ]'$ ; Joint pain '[ ]'$ ; Muscle pain '[ ]'$ ; Joint swelling '[ ]'$ ; Back Pain '[ ]'$ ; Rash '[ ]'$   Psych: Depression'[ ]'$ ; Anxiety'[ ]'$   Heme: Bleeding problems '[ ]'$ ; Clotting disorders '[ ]'$ ; Anemia '[ ]'$   Endocrine: Diabetes '[ ]'$ ; Thyroid dysfunction'[ ]'$   Physical Exam/Data:   Vitals:   04/06/21 0015 04/06/21 0030 04/06/21 0045 04/06/21 0100  BP: (!) 147/107 (!) 152/102 (!) 159/105 (!) 158/109  Pulse: (!) 101 (!) 108 (!) 108 (!) 106  Resp: (!) 26 (!) 26 (!) 23 19  Temp: 99 F (37.2 C)     TempSrc: Oral     SpO2: 100% 99% 100% 100%  Weight:      Height:       No intake or output data in the 24 hours ending 04/06/21 0114 Filed Weights   04/05/21 1848  Weight: 68 kg   Body mass index is 30.3 kg/m.  General:  NAD HEENT: normal Lymph: no  adenopathy Neck: no JVD Endocrine:  No thryomegaly Vascular: No carotid bruits; FA pulses 2+ bilaterally without bruits  Cardiac:  normal S1, S2; tachycardic and regular R; no murmur  Lungs:  clear to auscultation bilaterally, no wheezing, rhonchi or rales  Abd: soft, nontender, no hepatomegaly  Ext: no edema Musculoskeletal:  No deformities, BUE and BLE strength normal and equal Skin: warm and dry  Neuro:  CNs 2-12 intact, no focal abnormalities noted Psych:  Normal affect  EKG:  The EKG was personally reviewed and demonstrates:  sinus tach Telemetry:  Telemetry was personally reviewed and demonstrates:  sinus tachy  Relevant CV Studies: none  Laboratory Data:  Chemistry Recent Labs  Lab 04/05/21 1904  NA 136  K 3.8  CL 106  CO2 21*  GLUCOSE 108*  BUN 9  CREATININE 0.60  CALCIUM 9.1  GFRNONAA >60  ANIONGAP 9    No results for input(s): PROT, ALBUMIN, AST, ALT, ALKPHOS, BILITOT in the last 168 hours. Hematology Recent Labs  Lab 04/05/21 1904  WBC 14.2*  RBC 3.78*  HGB 6.6*  HCT 26.1*  MCV 69.0*  MCH 17.5*  MCHC 25.3*  RDW 22.5*  PLT 658*   Cardiac EnzymesNo results for input(s): TROPONINI in the last 168 hours. No results for input(s): TROPIPOC in the last 168 hours.  BNP Recent Labs  Lab 04/05/21 1904  BNP 43.5    DDimer No results for input(s): DDIMER in the last 168 hours.  Radiology/Studies:  CT Angio Chest PE W and/or Wo Contrast  Result Date: 04/05/2021 CLINICAL DATA:  Shortness of breath EXAM: CT ANGIOGRAPHY CHEST WITH CONTRAST TECHNIQUE: Multidetector CT imaging of the chest was performed using the standard protocol during bolus administration of intravenous contrast. Multiplanar CT image reconstructions and MIPs were obtained to evaluate the vascular anatomy. CONTRAST:  36m OMNIPAQUE IOHEXOL 350 MG/ML SOLN COMPARISON:  None. FINDINGS: Cardiovascular: There is a linear filling defect noted within the main left pulmonary artery, nonocclusive.  Similar web like filling defect is seen within the right lower lobe pulmonary artery. These appear to be webs that may be related to chronic pulmonary emboli. Filling defect is seen within a right lower lobe pulmonary arterial branch concerning for acute pulmonary embolus. No evidence of right heart strain. Heart is normal size. Aorta normal caliber. Mediastinum/Nodes: No mediastinal, hilar, or axillary adenopathy. Trachea and esophagus are unremarkable. Thyroid unremarkable. Lungs/Pleura: Lungs are clear. No focal airspace opacities or suspicious nodules. No effusions. Upper Abdomen: Imaging into the upper abdomen demonstrates no acute findings. Musculoskeletal: Chest wall soft tissues are unremarkable. No acute bony abnormality. Review of the MIP images confirms the above findings. IMPRESSION: Small acute appearing right lower lobe pulmonary embolus. Web-like filling defects in the central pulmonary arteries bilaterally may reflect webs from chronic pulmonary emboli. Electronically Signed   By: KRolm BaptiseM.D.   On: 04/05/2021 21:19   DG Chest Portable 1 View  Result Date: 04/05/2021 CLINICAL DATA:  Acute dyspnea. EXAM: PORTABLE CHEST 1 VIEW COMPARISON:  04/10/2015 FINDINGS: Low lung volumes.The cardiomediastinal contours are normal. Pulmonary vasculature is normal. No consolidation, pleural effusion, or pneumothorax. No acute osseous abnormalities are seen. IMPRESSION: Low lung volumes without acute abnormality. Electronically Signed   By: MKeith RakeM.D.   On: 04/05/2021 19:30    Assessment and Plan:   Acute and chronic PE NSTEMI Acute on chronic Anemia  Unclear at this time whether this is a true type I event given factors that could be driving elevated troponin including acute pulmonary embolism and anemia.  She has minimal risk factors for early onset atherosclerotic coronary disease at age 40  However, she will be on anticoagulation with heparin infusion regardless.  Okay to start aspirin  that this may exacerbate her bleeding.  Would be aggressive with iron repletion in this setting.  We will get an echo today to assess for wall motion abnormalities.  This may be a good case for a coronary CT angiogram to evaluate more closely.  Would hold off on any other MI therapies for now. Nitro did not change the character of her pain.   Recs: - continue anticoagulation for PE and nstemi - order echo today - if ongoing chest pain after blood transfusions, consider coronary Cta - iron repletion       For questions or updates, please contact [place] Please consult [email or web address] for contact info under     Signed, Doyne Keel, MD  04/06/2021 1:14 AM

## 2021-04-06 NOTE — Progress Notes (Signed)
  Echocardiogram 2D Echocardiogram with contrast has been performed.  Sherri Douglas F 04/06/2021, 12:16 PM

## 2021-04-06 NOTE — Plan of Care (Addendum)
Patient arrived from ED via stretcher. Ambulated to bathroom then assisted to bed. C/o SOB with exertion/activity. Denies chest pain. O x 4. Patient received 1 unit of PRBCs. On continuous heparin infusion. Urinated. No BM this shift. Labs collected post blood transfusion- awaiting results. C/o inability to sleep- MD contacted and orders given for a sleep aid.  Educated on use of call bell and call bell placed within reach. Bed in lowest position. Bed alarm on.  Problem: Elimination: Goal: Will not experience complications related to urinary retention Outcome: Progressing   Problem: Pain Managment: Goal: General experience of comfort will improve Outcome: Progressing   Problem: Skin Integrity: Goal: Risk for impaired skin integrity will decrease Outcome: Progressing

## 2021-04-06 NOTE — Progress Notes (Signed)
Lower extremity venous has been completed.   Preliminary results in CV Proc.   Sherri Douglas 04/06/2021 9:49 AM

## 2021-04-06 NOTE — Progress Notes (Addendum)
ANTICOAGULATION CONSULT NOTE - Follow Up Consult  Pharmacy Consult for heparin Indication: pulmonary embolus  Allergies  Allergen Reactions   Amoxicillin Rash    Has patient had a PCN reaction causing immediate rash, facial/tongue/throat swelling, SOB or lightheadedness with hypotension: No Has patient had a PCN reaction causing severe rash involving mucus membranes or skin necrosis: Yes Has patient had a PCN reaction that required hospitalization No Has patient had a PCN reaction occurring within the last 10 years: No If all of the above answers are "NO", then may proceed with Cephalosporin use.     Patient Measurements: Height: '4\' 11"'$  (149.9 cm) Weight: 68.3 kg (150 lb 8 oz) IBW/kg (Calculated) : 43.2 Heparin Dosing Weight: 58.2kg  Vital Signs: Temp: 98.1 F (36.7 C) (09/05 0445) Temp Source: Oral (09/05 0445) BP: 140/111 (09/05 0445) Pulse Rate: 97 (09/05 0445)  Labs: Recent Labs    04/05/21 1904 04/05/21 2104 04/05/21 2157 04/06/21 0051 04/06/21 0601  HGB 6.6*  --   --   --  8.0*  7.7*  HCT 26.1*  --   --   --  28.2*  27.7*  PLT 658*  --   --   --  567*  577*  APTT  --   --  24  --   --   LABPROT  --   --  12.8  --   --   INR  --   --  1.0  --   --   HEPARINUNFRC  --   --   --   --  0.28*  CREATININE 0.60  --   --   --  0.58  TROPONINIHS 166* 814*  --  1,023* 831*    Estimated Creatinine Clearance: 76.9 mL/min (by C-G formula based on SCr of 0.58 mg/dL).   Medications:  Infusions:   sodium chloride     heparin 950 Units/hr (04/06/21 0700)    Assessment: 40 yo female presented with acute chest pain and found to have a small PE on CTA in the ED. Of note, patient has history of heavy menstrual periods and anemia (last period ended Friday 9/2). No anticoagulation PTA. Pharmacy consulted on heparin dosing for PE.  Initial heparin level is subtherapeutic at 0.28 after initial bolus and on 950 units/hr drip (lower end of initial infusion dosing given Hgb,  d/w EDP). Hgb on admission was low (6.9, baseline 11-12s) but improved to 8.0 after 2 units of pRBC. Plt still elevated at 567 (575 on admission, baseline ~200s). No s/sx of bleeding or infusion problems noted per nursing.  Goal of Therapy:  Heparin level 0.3-0.5 units/mL > goal is on the lower side given hx of anemia and active transfusion Monitor platelets by anticoagulation protocol: Yes   Plan:  Increase heparin infusion slightly to 1000 units/hr 6 hour heparin level Daily heparin level and CBC Monitor for signs/symptoms of bleeding  Laurey Arrow, PharmD PGY1 Pharmacy Resident 04/06/2021  7:27 AM  Please check [email or web address] for unit-specific pharmacy phone numbers.

## 2021-04-06 NOTE — Progress Notes (Signed)
ANTICOAGULATION CONSULT NOTE - Follow Up Consult  Pharmacy Consult for heparin Indication: pulmonary embolus  Allergies  Allergen Reactions   Amoxicillin Rash    Has patient had a PCN reaction causing immediate rash, facial/tongue/throat swelling, SOB or lightheadedness with hypotension: No Has patient had a PCN reaction causing severe rash involving mucus membranes or skin necrosis: Yes Has patient had a PCN reaction that required hospitalization No Has patient had a PCN reaction occurring within the last 10 years: No If all of the above answers are "NO", then may proceed with Cephalosporin use.     Patient Measurements: Height: '4\' 11"'$  (149.9 cm) Weight: 68.3 kg (150 lb 8 oz) IBW/kg (Calculated) : 43.2 Heparin Dosing Weight: 58.2kg  Vital Signs: Temp: 97.3 F (36.3 C) (09/05 2052) Temp Source: Oral (09/05 2052) BP: 149/104 (09/05 2052) Pulse Rate: 100 (09/05 2052)  Labs: Recent Labs    04/05/21 1904 04/05/21 2104 04/05/21 2157 04/06/21 0051 04/06/21 0601 04/06/21 1406 04/06/21 2144  HGB 6.6*  --   --   --  8.0*  7.7*  --  9.3*  HCT 26.1*  --   --   --  28.2*  27.7*  --  32.5*  PLT 658*  --   --   --  567*  577*  --  551*  APTT  --   --  24  --   --   --   --   LABPROT  --   --  12.8  --   --   --   --   INR  --   --  1.0  --   --   --   --   HEPARINUNFRC  --   --   --   --  0.28* 0.12* 0.37  CREATININE 0.60  --   --   --  0.58  --   --   TROPONINIHS 166* 814*  --  1,023* 831*  --   --      Estimated Creatinine Clearance: 76.9 mL/min (by C-G formula based on SCr of 0.58 mg/dL).   Medications:  Infusions:   sodium chloride     heparin 1,150 Units/hr (04/06/21 1927)    Assessment: 40 yo female presented with acute chest pain and found to have a small PE on CTA in the ED. Of note, patient has history of heavy menstrual periods and anemia (last period ended Friday 9/2). No anticoagulation PTA. Pharmacy consulted on heparin dosing for PE.  Heparin level  0.37 at goal after slight rate increase to 1150 units/hr   Hgb on admission was low (6.9, baseline 11-12s) but improved to 9  after 2 units of pRBC.   Goal of Therapy:  Heparin level 0.3-0.5 units/mL > goal is on the lower side given hx of anemia and active transfusion Monitor platelets by anticoagulation protocol: Yes   Plan:  Continue heparin infusion 1150 units/hr Daily heparin level and CBC Monitor for signs/symptoms of bleeding   Bonnita Nasuti Pharm.D. CPP, BCPS Clinical Pharmacist [phone] 04/06/2021 10:37 PM    Please check [email or web address] for unit-specific pharmacy phone numbers.

## 2021-04-07 ENCOUNTER — Other Ambulatory Visit (HOSPITAL_COMMUNITY): Payer: Self-pay

## 2021-04-07 ENCOUNTER — Encounter: Payer: Self-pay | Admitting: Hematology and Oncology

## 2021-04-07 LAB — BPAM RBC
Blood Product Expiration Date: 202209252359
Blood Product Expiration Date: 202209262359
ISSUE DATE / TIME: 202209042336
ISSUE DATE / TIME: 202209051013
Unit Type and Rh: 7300
Unit Type and Rh: 7300

## 2021-04-07 LAB — TYPE AND SCREEN
ABO/RH(D): B POS
Antibody Screen: NEGATIVE
Unit division: 0
Unit division: 0

## 2021-04-07 LAB — CBC
HCT: 31 % — ABNORMAL LOW (ref 36.0–46.0)
Hemoglobin: 8.8 g/dL — ABNORMAL LOW (ref 12.0–15.0)
MCH: 20.3 pg — ABNORMAL LOW (ref 26.0–34.0)
MCHC: 28.4 g/dL — ABNORMAL LOW (ref 30.0–36.0)
MCV: 71.6 fL — ABNORMAL LOW (ref 80.0–100.0)
Platelets: 522 10*3/uL — ABNORMAL HIGH (ref 150–400)
RBC: 4.33 MIL/uL (ref 3.87–5.11)
RDW: 23.6 % — ABNORMAL HIGH (ref 11.5–15.5)
WBC: 11.7 10*3/uL — ABNORMAL HIGH (ref 4.0–10.5)
nRBC: 0.9 % — ABNORMAL HIGH (ref 0.0–0.2)

## 2021-04-07 LAB — HEPARIN LEVEL (UNFRACTIONATED): Heparin Unfractionated: 0.24 IU/mL — ABNORMAL LOW (ref 0.30–0.70)

## 2021-04-07 MED ORDER — APIXABAN 5 MG PO TABS: ORAL_TABLET | ORAL | 2 refills | Status: DC

## 2021-04-07 MED ORDER — ELIQUIS DVT/PE STARTER PACK 5 MG PO TBPK
ORAL_TABLET | ORAL | 2 refills | Status: DC
  Filled 2021-04-07: qty 74, 30d supply, fill #0
  Filled 2021-09-17: qty 74, 30d supply, fill #1

## 2021-04-07 MED ORDER — APIXABAN 5 MG PO TABS
10.0000 mg | ORAL_TABLET | Freq: Once | ORAL | Status: AC
  Administered 2021-04-07: 10 mg via ORAL
  Filled 2021-04-07: qty 2

## 2021-04-07 NOTE — Progress Notes (Signed)
ANTICOAGULATION CONSULT NOTE - Follow Up Consult  Pharmacy Consult for Heparin > Eliquis Indication: pulmonary embolus  Allergies  Allergen Reactions   Amoxicillin Rash    Has patient had a PCN reaction causing immediate rash, facial/tongue/throat swelling, SOB or lightheadedness with hypotension: No Has patient had a PCN reaction causing severe rash involving mucus membranes or skin necrosis: Yes Has patient had a PCN reaction that required hospitalization No Has patient had a PCN reaction occurring within the last 10 years: No If all of the above answers are "NO", then may proceed with Cephalosporin use.     Patient Measurements: Height: '4\' 11"'$  (149.9 cm) Weight: 68.9 kg (151 lb 14.4 oz) (scale c) IBW/kg (Calculated) : 43.2 Heparin Dosing Weight: 68.9 kg  Vital Signs: Temp: 98 F (36.7 C) (09/06 0505) Temp Source: Oral (09/06 0505) BP: 134/98 (09/06 0505) Pulse Rate: 88 (09/06 0505)  Labs: Recent Labs    04/05/21 1904 04/05/21 1904 04/05/21 2104 04/05/21 2157 04/06/21 0051 04/06/21 0601 04/06/21 1406 04/06/21 2144 04/07/21 0401  HGB 6.6*  --   --   --   --  8.0*  7.7*  --  9.3* 8.8*  HCT 26.1*  --   --   --   --  28.2*  27.7*  --  32.5* 31.0*  PLT 658*  --   --   --   --  567*  577*  --  551* 522*  APTT  --   --   --  24  --   --   --   --   --   LABPROT  --   --   --  12.8  --   --   --   --   --   INR  --   --   --  1.0  --   --   --   --   --   HEPARINUNFRC  --    < >  --   --   --  0.28* 0.12* 0.37 0.24*  CREATININE 0.60  --   --   --   --  0.58  --   --   --   TROPONINIHS 166*  --  814*  --  1,023* 831*  --   --   --    < > = values in this interval not displayed.    Estimated Creatinine Clearance: 77.4 mL/min (by C-G formula based on SCr of 0.58 mg/dL).  Assessment: 40 yr old female on IV heparin for pulmonary embolus. Heparin level was low early am and infusion rate increased.  Follow up level was due later morning, but cancelled due to plan  transition to Eliquis and discharge.  Hx heavy menstrual bleeding and chronic anemia. Received 2 units PRBCs 9/4>9/5.  No current bleeding.  Goal of Therapy:  Heparin level 0.3-0.7 units/ml Appropriate Eliquis regimen for indication Monitor platelets by anticoagulation protocol: Yes   Plan:  Heparin stopped and first dose of Eliquis 10 mg given. Eliquis 10 mg BID x 1 week then 5 mg BID. Discussed anticoagulation precautions, including prn NSAID use and pregnancy precautions with Eliquis.  Arty Baumgartner, RPh 04/07/2021,12:37 PM

## 2021-04-07 NOTE — Discharge Summary (Signed)
Physician Discharge Summary  Pacience Douglas [id number] DOB: 08-09-1980 DOA: 04/05/2021  PCP: Rikki Spearing, NP  Admit date: 04/05/2021 Discharge date: 04/07/2021  Admitted From: Home  Discharge disposition: Home  Recommendations for Outpatient Follow-Up:   Follow up with your primary care provider in one week.  Check CBC in the next visit. Patient has been started on Eliquis but has ongoing menorrhagia.  Patient was advised to follow-up with her gynecologist. Follow-up with cardiology as outpatient.  Discharge Diagnosis:   Principal Problem:   Acute pulmonary embolism (HCC) Active Problems:   Iron deficiency anemia   Menorrhagia   Elevated troponin  Discharge Condition: Improved.  Diet recommendation:   Regular.  Wound care: None.  Code status: Full.   History of Present Illness:   Sherri Douglas is a 40 y.o. 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  Patient had difficulty ambulating.  EMS was called in and patient was brought into the hospital.  In the ED, patient was noted to have sinus tachycardia.  CT angiogram of the chest showed small pulmonary embolism.  Troponins were elevated as well.  Hemoglobin was noted to be 6.6 so 1 unit of packed RBC was transfused.  Patient was then admitted to hospital for further evaluation and treatment.    Hospital Course:   Following conditions were addressed during hospitalization as listed below,  Acute pulmonary embolism  CTA chest chest showed features consistent with small acute appearing right lower lobe pulmonary embolus on the background of chronic  pulmonary embolism.  Patient was on IV heparin drip initially.  This has been changed to oral Eliquis on discharge.  Patient remained hemodynamically stable    Elevated high sensitive troponin  With chest pain.  Cardiology saw the patient.  2D echocardiogram showed normal  wall motion and preserved EF.  Cardiology has seen the patient at this time.  Elevated troponin likely from pulmonary embolism.  No further work-up planned at this time.  Patient will be continued on Eliquis on discharge.  Patient denies any chest pain prior to discharge.   Microcytic hypochromic anemia likely from menorrhagia, patient received 1 unit of PRBC transfusion.   Patient has been recommended to follow-up with her OB/GYN.  She was advised hysterectomy for fibroid uterus but she is not still sure about it.  I have discussed with her in detail regarding the propensity for excessive bleeding while on Eliquis.  Patient has understood the risk versus benefits of being on Eliquis.  I have advised her to seek medical attention if she continues to have excessive bleeding.   Tobacco abuse Counseled about quitting.  Disposition.  At this time, patient is stable for disposition home with outpatient PCP and cardiology follow-up.  Medical Consultants:   Cardiology  Procedures:    None Subjective:   Today, patient was seen and examined at bedside.  Denies any chest pain, shortness of breath, dizziness, lightheadedness or diaphoresis.  Discharge Exam:   Vitals:   04/07/21 0016 04/07/21 0505  BP: (!) 138/95 (!) 134/98  Pulse: 87 88  Resp: 18 14  Temp: 98.3 F (36.8 C) 98 F (36.7 C)  SpO2: 97% 97%   Vitals:   04/06/21 1934 04/06/21 2052 04/07/21 0016 04/07/21 0505  BP: (!) 142/106 (!) 149/104 (!) 138/95 (!) 134/98  Pulse: 98 100 87 88  Resp: '18 14 18 14  '$ Temp: 98.7 F (37.1 C) (!) 97.3 F (  36.3 C) 98.3 F (36.8 C) 98 F (36.7 C)  TempSrc: Oral Oral Oral Oral  SpO2: 96% 99% 97% 97%  Weight:    68.9 kg  Height:       General: Alert awake, not in obvious distress, obese HENT: pupils equally reacting to light,  No scleral pallor or icterus noted. Oral mucosa is moist.  Chest:  Clear breath sounds.  Diminished breath sounds bilaterally. No crackles or wheezes.  CVS: S1 &S2  heard. No murmur.  Regular rate and rhythm. Abdomen: Soft, nontender, nondistended.  Bowel sounds are heard.   Extremities: No cyanosis, clubbing or edema.  Peripheral pulses are palpable. Psych: Alert, awake and oriented, normal mood CNS:  No cranial nerve deficits.  Power equal in all extremities.   Skin: Warm and dry.  No rashes noted.  The results of significant diagnostics from this hospitalization (including imaging, microbiology, ancillary and laboratory) are listed below for reference.     Diagnostic Studies:   CT Angio Chest PE W and/or Wo Contrast  Result Date: 04/05/2021 CLINICAL DATA:  Shortness of breath EXAM: CT ANGIOGRAPHY CHEST WITH CONTRAST TECHNIQUE: Multidetector CT imaging of the chest was performed using the standard protocol during bolus administration of intravenous contrast. Multiplanar CT image reconstructions and MIPs were obtained to evaluate the vascular anatomy. CONTRAST:  37m OMNIPAQUE IOHEXOL 350 MG/ML SOLN COMPARISON:  None. FINDINGS: Cardiovascular: There is a linear filling defect noted within the main left pulmonary artery, nonocclusive. Similar web like filling defect is seen within the right lower lobe pulmonary artery. These appear to be webs that may be related to chronic pulmonary emboli. Filling defect is seen within a right lower lobe pulmonary arterial branch concerning for acute pulmonary embolus. No evidence of right heart strain. Heart is normal size. Aorta normal caliber. Mediastinum/Nodes: No mediastinal, hilar, or axillary adenopathy. Trachea and esophagus are unremarkable. Thyroid unremarkable. Lungs/Pleura: Lungs are clear. No focal airspace opacities or suspicious nodules. No effusions. Upper Abdomen: Imaging into the upper abdomen demonstrates no acute findings. Musculoskeletal: Chest wall soft tissues are unremarkable. No acute bony abnormality. Review of the MIP images confirms the above findings. IMPRESSION: Small acute appearing right lower lobe  pulmonary embolus. Web-like filling defects in the central pulmonary arteries bilaterally may reflect webs from chronic pulmonary emboli. Electronically Signed   By: KRolm BaptiseM.D.   On: 04/05/2021 21:19   DG Chest Portable 1 View  Result Date: 04/05/2021 CLINICAL DATA:  Acute dyspnea. EXAM: PORTABLE CHEST 1 VIEW COMPARISON:  04/10/2015 FINDINGS: Low lung volumes.The cardiomediastinal contours are normal. Pulmonary vasculature is normal. No consolidation, pleural effusion, or pneumothorax. No acute osseous abnormalities are seen. IMPRESSION: Low lung volumes without acute abnormality. Electronically Signed   By: MKeith RakeM.D.   On: 04/05/2021 19:30   ECHOCARDIOGRAM COMPLETE  Result Date: 04/06/2021    ECHOCARDIOGRAM REPORT   Patient Name:   AINOLA GENTISDate of Exam: 04/06/2021 Medical Rec #:  [id number]   Height:       59.0 in Accession #:    [id number]  Weight:       150.5 lb Date of Birth:  [birth year]   BSA:          1.634 m Patient Age:    89years     BP:           145/110 mmHg Patient Gender: F            HR:  109 bpm. Exam Location:  Inpatient Procedure: 2D Echo, Cardiac Doppler, Color Doppler and Intracardiac            Opacification Agent Indications:    Pulmonary embolism  History:        Patient has no prior history of Echocardiogram examinations.                 Elevated troponin.  Sonographer:    Merrie Roof RDCS Referring Phys: [place]  1. Left ventricular ejection fraction, by estimation, is 55 to 60%. The left ventricle has normal function. The left ventricle has no regional wall motion abnormalities. Left ventricular diastolic parameters were normal.  2. Right ventricular systolic function is normal. The right ventricular size is normal.  3. The mitral valve is normal in structure. No evidence of mitral valve regurgitation. No evidence of mitral stenosis.  4. The aortic valve is normal in structure. Aortic valve regurgitation is not visualized.  No aortic stenosis is present.  5. The inferior vena cava is normal in size with greater than 50% respiratory variability, suggesting right atrial pressure of 3 mmHg. FINDINGS  Left Ventricle: Left ventricular ejection fraction, by estimation, is 55 to 60%. The left ventricle has normal function. The left ventricle has no regional wall motion abnormalities. The left ventricular internal cavity size was normal in size. There is  no left ventricular hypertrophy. Left ventricular diastolic parameters were normal. Right Ventricle: The right ventricular size is normal. No increase in right ventricular wall thickness. Right ventricular systolic function is normal. Left Atrium: Left atrial size was normal in size. Right Atrium: Right atrial size was normal in size. Pericardium: There is no evidence of pericardial effusion. Mitral Valve: The mitral valve is normal in structure. No evidence of mitral valve regurgitation. No evidence of mitral valve stenosis. Tricuspid Valve: The tricuspid valve is normal in structure. Tricuspid valve regurgitation is trivial. No evidence of tricuspid stenosis. Aortic Valve: The aortic valve is normal in structure. Aortic valve regurgitation is not visualized. No aortic stenosis is present. Aortic valve mean gradient measures 4.0 mmHg. Aortic valve peak gradient measures 7.0 mmHg. Aortic valve area, by VTI measures 1.74 cm. Pulmonic Valve: The pulmonic valve was normal in structure. Pulmonic valve regurgitation is trivial. No evidence of pulmonic stenosis. Aorta: The aortic root is normal in size and structure. Venous: The inferior vena cava is normal in size with greater than 50% respiratory variability, suggesting right atrial pressure of 3 mmHg. IAS/Shunts: No atrial level shunt detected by color flow Doppler.  LEFT VENTRICLE PLAX 2D LVIDd:         3.30 cm  Diastology LVIDs:         2.30 cm  LV e' medial:    11.10 cm/s LV PW:         0.80 cm  LV E/e' medial:  5.9 LV IVS:        0.90 cm   LV e' lateral:   13.90 cm/s LVOT diam:     1.80 cm  LV E/e' lateral: 4.7 LV SV:         40 LV SV Index:   24 LVOT Area:     2.54 cm  RIGHT VENTRICLE RV Basal diam:  4.40 cm RV Mid diam:    3.30 cm RV S prime:     9.30 cm/s TAPSE (M-mode): 1.6 cm LEFT ATRIUM           Index      RIGHT  ATRIUM           Index LA diam:      3.10 cm 1.90 cm/m RA Area:     16.60 cm LA Vol (A4C): 14.5 ml 8.87 ml/m RA Volume:   49.30 ml  30.16 ml/m  AORTIC VALVE AV Area (Vmax):    1.95 cm AV Area (Vmean):   1.70 cm AV Area (VTI):     1.74 cm AV Vmax:           132.00 cm/s AV Vmean:          88.400 cm/s AV VTI:            0.228 m AV Peak Grad:      7.0 mmHg AV Mean Grad:      4.0 mmHg LVOT Vmax:         101.00 cm/s LVOT Vmean:        59.200 cm/s LVOT VTI:          0.156 m LVOT/AV VTI ratio: 0.68  AORTA Ao Root diam: 2.70 cm Ao Asc diam:  2.70 cm MITRAL VALVE MV Area (PHT): 4.36 cm    SHUNTS MV Decel Time: 174 msec    Systemic VTI:  0.16 m MV E velocity: 65.40 cm/s  Systemic Diam: 1.80 cm MV A velocity: 71.70 cm/s MV E/A ratio:  0.91 Jenkins Rouge MD Electronically signed by Jenkins Rouge MD Signature Date/Time: 04/06/2021/12:51:28 PM    Final    VAS [place] LOWER EXTREMITY VENOUS (DVT)  Result Date: 04/06/2021  Lower Venous DVT Study Patient Name:  MICOLE SICKMAN  Date of Exam:   04/06/2021 Medical Rec #: [id number]     Accession #:    [id number] Date of Birth: April 09, 1981     Patient Gender: F Patient Age:   40 years Exam Location:  [facility] Procedure:      VAS [place] LOWER EXTREMITY VENOUS (DVT) Referring Phys: Gean Birchwood --------------------------------------------------------------------------------  Indications: Pulmonary embolism.  Comparison Study: no prior Performing Technologist: Archie Patten RVS  Examination Guidelines: A complete evaluation includes B-mode imaging, spectral Doppler, color Doppler, and power Doppler as needed of all accessible portions of each vessel. Bilateral testing is considered an integral part of  a complete examination. Limited examinations for reoccurring indications may be performed as noted. The reflux portion of the exam is performed with the patient in reverse Trendelenburg.  +---------+---------------+---------+-----------+----------+--------------+ RIGHT    CompressibilityPhasicitySpontaneityPropertiesThrombus Aging +---------+---------------+---------+-----------+----------+--------------+ CFV      Full           Yes      Yes                                 +---------+---------------+---------+-----------+----------+--------------+ SFJ      Full                                                        +---------+---------------+---------+-----------+----------+--------------+ FV Prox  Full                                                        +---------+---------------+---------+-----------+----------+--------------+ FV Mid  Full                                                        +---------+---------------+---------+-----------+----------+--------------+ FV DistalFull                                                        +---------+---------------+---------+-----------+----------+--------------+ PFV      Full                                                        +---------+---------------+---------+-----------+----------+--------------+ POP      Full           Yes      Yes                                 +---------+---------------+---------+-----------+----------+--------------+ PTV      Full                                                        +---------+---------------+---------+-----------+----------+--------------+ PERO     Full                                                        +---------+---------------+---------+-----------+----------+--------------+   +---------+---------------+---------+-----------+----------+--------------+ LEFT     CompressibilityPhasicitySpontaneityPropertiesThrombus Aging  +---------+---------------+---------+-----------+----------+--------------+ CFV      Full           Yes      Yes                                 +---------+---------------+---------+-----------+----------+--------------+ SFJ      Full                                                        +---------+---------------+---------+-----------+----------+--------------+ FV Prox  Full                                                        +---------+---------------+---------+-----------+----------+--------------+ FV Mid   Full                                                        +---------+---------------+---------+-----------+----------+--------------+  FV DistalFull                                                        +---------+---------------+---------+-----------+----------+--------------+ PFV      Full                                                        +---------+---------------+---------+-----------+----------+--------------+ POP      Full           Yes      Yes                                 +---------+---------------+---------+-----------+----------+--------------+ PTV      Full                                                        +---------+---------------+---------+-----------+----------+--------------+ PERO     Full                                                        +---------+---------------+---------+-----------+----------+--------------+     Summary: BILATERAL: - No evidence of deep vein thrombosis seen in the lower extremities, bilaterally. -No evidence of popliteal cyst, bilaterally.   *See table(s) above for measurements and observations. Electronically signed by Deitra Mayo MD on 04/06/2021 at 3:18:12 PM.    Final      Labs:   Basic Metabolic Panel: Recent Labs  Lab 04/05/21 1904 04/06/21 0601  NA 136 134*  K 3.8 3.6  CL 106 105  CO2 21* 22  GLUCOSE 108* 105*  BUN 9 9  CREATININE 0.60 0.58  CALCIUM 9.1 8.9    GFR Estimated Creatinine Clearance: 77.4 mL/min (by C-G formula based on SCr of 0.58 mg/dL). Liver Function Tests: Recent Labs  Lab 04/06/21 0601  AST 17  ALT 12  ALKPHOS 47  BILITOT 0.8  PROT 6.4*  ALBUMIN 3.2*   No results for input(s): LIPASE, AMYLASE in the last 168 hours. No results for input(s): AMMONIA in the last 168 hours. Coagulation profile Recent Labs  Lab 04/05/21 2157  INR 1.0    CBC: Recent Labs  Lab 04/05/21 1904 04/06/21 0601 04/06/21 2144 04/07/21 0401  WBC 14.2* 11.6*  11.8* 11.7* 11.7*  NEUTROABS 11.0* 8.0*  --   --   HGB 6.6* 8.0*  7.7* 9.3* 8.8*  HCT 26.1* 28.2*  27.7* 32.5* 31.0*  MCV 69.0* 70.5*  70.8* 71.6* 71.6*  PLT 658* 567*  577* 551* 522*   Cardiac Enzymes: No results for input(s): CKTOTAL, CKMB, CKMBINDEX, TROPONINI in the last 168 hours. BNP: Invalid input(s): POCBNP CBG: No results for input(s): GLUCAP in the last 168 hours. D-Dimer No results for input(s): DDIMER in the last 72 hours. Hgb A1c No results for input(s): HGBA1C in the  last 72 hours. Lipid Profile No results for input(s): CHOL, HDL, LDLCALC, TRIG, CHOLHDL, LDLDIRECT in the last 72 hours. Thyroid function studies No results for input(s): TSH, T4TOTAL, T3FREE, THYROIDAB in the last 72 hours.  Invalid input(s): FREET3 Anemia work up No results for input(s): VITAMINB12, FOLATE, FERRITIN, TIBC, IRON, RETICCTPCT in the last 72 hours. Microbiology Recent Results (from the past 240 hour(s))  Resp Panel by RT-PCR (Flu A&B, Covid) Nasopharyngeal Swab     Status: None   Collection Time: 04/05/21  7:06 PM   Specimen: Nasopharyngeal Swab; Nasopharyngeal(NP) swabs in vial transport medium  Result Value Ref Range Status   SARS Coronavirus 2 by RT PCR NEGATIVE NEGATIVE Final    Comment: (NOTE) SARS-CoV-2 target nucleic acids are NOT DETECTED.  The SARS-CoV-2 RNA is generally detectable in upper respiratory specimens during the acute phase of infection. The  lowest concentration of SARS-CoV-2 viral copies this assay can detect is 138 copies/mL. A negative result does not preclude SARS-Cov-2 infection and should not be used as the sole basis for treatment or other patient management decisions. A negative result may occur with  improper specimen collection/handling, submission of specimen other than nasopharyngeal swab, presence of viral mutation(s) within the areas targeted by this assay, and inadequate number of viral copies(<138 copies/mL). A negative result must be combined with clinical observations, patient history, and epidemiological information. The expected result is Negative.  Fact Sheet for Patients:  EntrepreneurPulse.com.au  Fact Sheet for Healthcare Providers:  IncredibleEmployment.be  This test is no t yet approved or cleared by the [place] FDA and  has been authorized for detection and/or diagnosis of SARS-CoV-2 by FDA under an Emergency Use Authorization (EUA). This EUA will remain  in effect (meaning this test can be used) for the duration of the COVID-19 declaration under Section 564(b)(1) of the Act, 21 U.S.C.section 360bbb-3(b)(1), unless the authorization is terminated  or revoked sooner.       Influenza A by PCR NEGATIVE NEGATIVE Final   Influenza B by PCR NEGATIVE NEGATIVE Final    Comment: (NOTE) The Xpert Xpress SARS-CoV-2/FLU/RSV plus assay is intended as an aid in the diagnosis of influenza from Nasopharyngeal swab specimens and should not be used as a sole basis for treatment. Nasal washings and aspirates are unacceptable for Xpert Xpress SARS-CoV-2/FLU/RSV testing.  Fact Sheet for Patients: EntrepreneurPulse.com.au  Fact Sheet for Healthcare Providers: IncredibleEmployment.be  This test is not yet approved or cleared by the [place] FDA and has been authorized for detection and/or diagnosis of SARS-CoV-2 by FDA under  an Emergency Use Authorization (EUA). This EUA will remain in effect (meaning this test can be used) for the duration of the COVID-19 declaration under Section 564(b)(1) of the Act, 21 U.S.C. section 360bbb-3(b)(1), unless the authorization is terminated or revoked.  Performed at [facility], [place] [address] [place], [place] [address]      Discharge Instructions:   Discharge Instructions     Diet - low sodium heart healthy   Complete by: As directed    Discharge instructions   Complete by: As directed    Follow-up with your primary care physician in 1 week.  Check blood work at that time.  If you continue to have excessive bleeding, seek medical attention and do not take blood thinner.  Please keep  the appointment with your gynecologist.  Please take precautions on blood thinners. Do not take aspirin or motrin with blood thinner. Do not overexert.  Follow-up with cardiology as scheduled  by the clinic.   Increase activity slowly   Complete by: As directed       Allergies as of 04/07/2021       Reactions   Amoxicillin Rash   Has patient had a PCN reaction causing immediate rash, facial/tongue/throat swelling, SOB or lightheadedness with hypotension: No Has patient had a PCN reaction causing severe rash involving mucus membranes or skin necrosis: Yes Has patient had a PCN reaction that required hospitalization No Has patient had a PCN reaction occurring within the last 10 years: No If all of the above answers are "NO", then may proceed with Cephalosporin use.        Medication List     STOP taking these medications    ibuprofen 200 MG tablet Commonly known as: ADVIL   NIFEdipine 30 MG 24 hr tablet Commonly known as: ADALAT CC   oxyCODONE-acetaminophen 5-325 MG tablet Commonly known as: PERCOCET/ROXICET       TAKE these medications    Eliquis DVT/PE Starter Pack Generic drug: Apixaban Starter Pack ('10mg'$  and '5mg'$ ) Take 2 tablets ('10mg'$ ) twice daily for 7  days, then 1 tablet ('5mg'$ ) twice daily   naproxen sodium 220 MG tablet Commonly known as: ALEVE Take 440 mg by mouth daily as needed (pain).        Follow-up Information     Francine Graven D, NP Follow up in 1 week(s).   Specialty: Nurse Practitioner Why: regular followup, blood work,The office will call patient Contact information: [address] [place] [id number]         Werner Lean, MD Follow up.   Specialty: Cardiology Why: Hosptial follow-up with Cardiology scheduled for 06/12/2021 at 11:40am. Please arrive 15 minutes early for check-in. If this date/time does not work for you, please call our office to reschedule. Contact information: [address] [address] [phone]                  Time coordinating discharge: 39 minutes  Signed:  Raphael Espe  [facility] 04/07/2021, 4:13 PM

## 2021-04-07 NOTE — Progress Notes (Signed)
ANTICOAGULATION CONSULT NOTE - Follow Up Consult  Pharmacy Consult for heparin Indication: pulmonary embolus  Labs: Recent Labs    04/05/21 1904 04/05/21 1904 04/05/21 2104 04/05/21 2157 04/06/21 0051 04/06/21 0601 04/06/21 1406 04/06/21 2144 04/07/21 0401  HGB 6.6*  --   --   --   --  8.0*  7.7*  --  9.3* 8.8*  HCT 26.1*  --   --   --   --  28.2*  27.7*  --  32.5* 31.0*  PLT 658*  --   --   --   --  567*  577*  --  551* 522*  APTT  --   --   --  24  --   --   --   --   --   LABPROT  --   --   --  12.8  --   --   --   --   --   INR  --   --   --  1.0  --   --   --   --   --   HEPARINUNFRC  --    < >  --   --   --  0.28* 0.12* 0.37 0.24*  CREATININE 0.60  --   --   --   --  0.58  --   --   --   TROPONINIHS 166*  --  814*  --  1,023* 831*  --   --   --    < > = values in this interval not displayed.    Assessment: 40yo female subtherapeutic on heparin after one level at goal; no infusion issues (other than some positional occlusions to IV line) or signs of bleeding per RN.  Goal of Therapy:  Heparin level 0.3-0.5 units/ml   Plan:  Will increase heparin infusion by 1-2 units/kg/hr to 1250 units/hr and check level in 6 hours.    Sherri Douglas, PharmD, BCPS  04/07/2021,4:57 AM

## 2021-04-07 NOTE — Plan of Care (Signed)

## 2021-04-07 NOTE — Discharge Instructions (Signed)
Information on my medicine - ELIQUIS (apixaban)  This medication education was reviewed with me or my healthcare representative as part of my discharge preparation.   Why was Eliquis prescribed for you? Eliquis was prescribed to treat blood clots that may have been found in the veins of your legs (deep vein thrombosis) or in your lungs (pulmonary embolism) and to reduce the risk of them occurring again.  What do You need to know about Eliquis ? The starting dose is 10 mg (two 5 mg tablets) taken TWICE daily for the FIRST SEVEN (7) DAYS, then on 04/14/21  the dose is reduced to ONE 5 mg tablet taken TWICE daily.  Eliquis may be taken with or without food.   Try to take the dose about the same time in the morning and in the evening. If you have difficulty swallowing the tablet whole please discuss with your pharmacist how to take the medication safely.  Take Eliquis exactly as prescribed and DO NOT stop taking Eliquis without talking to the doctor who prescribed the medication.  Stopping may increase your risk of developing a new blood clot.  Refill your prescription before you run out.  After discharge, you should have regular check-up appointments with your healthcare provider that is prescribing your Eliquis.    What do you do if you miss a dose? If a dose of ELIQUIS is not taken at the scheduled time, take it as soon as possible on the same day and twice-daily administration should be resumed. The dose should not be doubled to make up for a missed dose.  Important Safety Information A possible side effect of Eliquis is bleeding. You should call your healthcare provider right away if you experience any of the following: Bleeding from an injury or your nose that does not stop. Unusual colored urine (red or dark brown) or unusual colored stools (red or black). Unusual bruising for unknown reasons. A serious fall or if you hit your head (even if there is no bleeding).  Some medicines  may interact with Eliquis and might increase your risk of bleeding or clotting while on Eliquis. To help avoid this, consult your healthcare provider or pharmacist prior to using any new prescription or non-prescription medications, including herbals, vitamins, non-steroidal anti-inflammatory drugs (NSAIDs) and supplements.  This website has more information on Eliquis (apixaban): http://www.eliquis.com/eliquis/home

## 2021-04-07 NOTE — Progress Notes (Signed)
Progress Note  Patient Name: Sherri Douglas Date of Encounter: 04/07/2021  Primary Cardiologist: New to [facility]; Leondra Cullin  Subjective   Since last evaluation troponin peaked to 1023 in the setting of acute PE and anemia.  Started on heparin and has tolerated well  Inpatient Medications    Scheduled Meds:  aspirin  81 mg Oral Daily   atorvastatin  80 mg Oral Daily   mouth rinse  15 mL Mouth Rinse BID   pantoprazole  40 mg Oral BID   Continuous Infusions:  sodium chloride     heparin 1,250 Units/hr (04/07/21 0507)   PRN Meds: acetaminophen **OR** acetaminophen, alum & mag hydroxide-simeth, nitroGLYCERIN, zolpidem   Vital Signs    Vitals:   04/06/21 1934 04/06/21 2052 04/07/21 0016 04/07/21 0505  BP: (!) 142/106 (!) 149/104 (!) 138/95 (!) 134/98  Pulse: 98 100 87 88  Resp: '18 14 18 14  '$ Temp: 98.7 F (37.1 C) (!) 97.3 F (36.3 C) 98.3 F (36.8 C) 98 F (36.7 C)  TempSrc: Oral Oral Oral Oral  SpO2: 96% 99% 97% 97%  Weight:    68.9 kg  Height:        Intake/Output Summary (Last 24 hours) at 04/07/2021 1038 Last data filed at 04/07/2021 0900 Gross per 24 hour  Intake 1149.62 ml  Output 800 ml  Net 349.62 ml   Filed Weights   04/05/21 1848 04/06/21 0133 04/07/21 0505  Weight: 68 kg 68.3 kg 68.9 kg    Telemetry    SR to sinus tachycardia - Personally Reviewed  ECG    Sinus tachycardia rate 108 - Personally Reviewed  Physical Exam   GEN: No acute distress.   Neck: No JVD Cardiac: RRR, no murmurs, rubs, or gallops.  Respiratory: Clear to auscultation bilaterally. GI: Soft, nontender, non-distended  MS: No edema; No deformity. Neuro:  Nonfocal  Psych: Normal affect   Labs    Chemistry Recent Labs  Lab 04/05/21 1904 04/06/21 0601  NA 136 134*  K 3.8 3.6  CL 106 105  CO2 21* 22  GLUCOSE 108* 105*  BUN 9 9  CREATININE 0.60 0.58  CALCIUM 9.1 8.9  PROT  --  6.4*  ALBUMIN  --  3.2*  AST  --  17  ALT  --  12  ALKPHOS  --  47  BILITOT  --  0.8   GFRNONAA >60 >60  ANIONGAP 9 7     Hematology Recent Labs  Lab 04/06/21 0601 04/06/21 2144 04/07/21 0401  WBC 11.6*  11.8* 11.7* 11.7*  RBC 4.00  3.91 4.54 4.33  HGB 8.0*  7.7* 9.3* 8.8*  HCT 28.2*  27.7* 32.5* 31.0*  MCV 70.5*  70.8* 71.6* 71.6*  MCH 20.0*  19.7* 20.5* 20.3*  MCHC 28.4*  27.8* 28.6* 28.4*  RDW 24.3*  24.4* 23.3* 23.6*  PLT 567*  577* 551* 522*    Cardiac EnzymesNo results for input(s): TROPONINI in the last 168 hours. No results for input(s): TROPIPOC in the last 168 hours.   BNP Recent Labs  Lab 04/05/21 1904  BNP 43.5     DDimer No results for input(s): DDIMER in the last 168 hours.   Radiology    CT Angio Chest PE W and/or Wo Contrast  Result Date: 04/05/2021 CLINICAL DATA:  Shortness of breath EXAM: CT ANGIOGRAPHY CHEST WITH CONTRAST TECHNIQUE: Multidetector CT imaging of the chest was performed using the standard protocol during bolus administration of intravenous contrast. Multiplanar CT image reconstructions and MIPs were  obtained to evaluate the vascular anatomy. CONTRAST:  57m OMNIPAQUE IOHEXOL 350 MG/ML SOLN COMPARISON:  None. FINDINGS: Cardiovascular: There is a linear filling defect noted within the main left pulmonary artery, nonocclusive. Similar web like filling defect is seen within the right lower lobe pulmonary artery. These appear to be webs that may be related to chronic pulmonary emboli. Filling defect is seen within a right lower lobe pulmonary arterial branch concerning for acute pulmonary embolus. No evidence of right heart strain. Heart is normal size. Aorta normal caliber. Mediastinum/Nodes: No mediastinal, hilar, or axillary adenopathy. Trachea and esophagus are unremarkable. Thyroid unremarkable. Lungs/Pleura: Lungs are clear. No focal airspace opacities or suspicious nodules. No effusions. Upper Abdomen: Imaging into the upper abdomen demonstrates no acute findings. Musculoskeletal: Chest wall soft tissues are unremarkable.  No acute bony abnormality. Review of the MIP images confirms the above findings. IMPRESSION: Small acute appearing right lower lobe pulmonary embolus. Web-like filling defects in the central pulmonary arteries bilaterally may reflect webs from chronic pulmonary emboli. Electronically Signed   By: KRolm BaptiseM.D.   On: 04/05/2021 21:19   DG Chest Portable 1 View  Result Date: 04/05/2021 CLINICAL DATA:  Acute dyspnea. EXAM: PORTABLE CHEST 1 VIEW COMPARISON:  04/10/2015 FINDINGS: Low lung volumes.The cardiomediastinal contours are normal. Pulmonary vasculature is normal. No consolidation, pleural effusion, or pneumothorax. No acute osseous abnormalities are seen. IMPRESSION: Low lung volumes without acute abnormality. Electronically Signed   By: MKeith RakeM.D.   On: 04/05/2021 19:30   ECHOCARDIOGRAM COMPLETE  Result Date: 04/06/2021    ECHOCARDIOGRAM REPORT   Patient Name:   Sherri TEMORESDate of Exam: 04/06/2021 Medical Rec #:  [id number]   Height:       59.0 in Accession #:    [id number]  Weight:       150.5 lb Date of Birth:  712-31-1982   BSA:          1.634 m Patient Age:    89years     BP:           145/110 mmHg Patient Gender: F            HR:           109 bpm. Exam Location:  Inpatient Procedure: 2D Echo, Cardiac Doppler, Color Doppler and Intracardiac            Opacification Agent Indications:    Pulmonary embolism  History:        Patient has no prior history of Echocardiogram examinations.                 Elevated troponin.  Sonographer:    RMerrie RoofRDCS Referring Phys: 3[place] 1. Left ventricular ejection fraction, by estimation, is 55 to 60%. The left ventricle has normal function. The left ventricle has no regional wall motion abnormalities. Left ventricular diastolic parameters were normal.  2. Right ventricular systolic function is normal. The right ventricular size is normal.  3. The mitral valve is normal in structure. No evidence of mitral valve  regurgitation. No evidence of mitral stenosis.  4. The aortic valve is normal in structure. Aortic valve regurgitation is not visualized. No aortic stenosis is present.  5. The inferior vena cava is normal in size with greater than 50% respiratory variability, suggesting right atrial pressure of 3 mmHg. FINDINGS  Left Ventricle: Left ventricular ejection fraction, by estimation, is 55 to 60%. The left ventricle has normal function.  The left ventricle has no regional wall motion abnormalities. The left ventricular internal cavity size was normal in size. There is  no left ventricular hypertrophy. Left ventricular diastolic parameters were normal. Right Ventricle: The right ventricular size is normal. No increase in right ventricular wall thickness. Right ventricular systolic function is normal. Left Atrium: Left atrial size was normal in size. Right Atrium: Right atrial size was normal in size. Pericardium: There is no evidence of pericardial effusion. Mitral Valve: The mitral valve is normal in structure. No evidence of mitral valve regurgitation. No evidence of mitral valve stenosis. Tricuspid Valve: The tricuspid valve is normal in structure. Tricuspid valve regurgitation is trivial. No evidence of tricuspid stenosis. Aortic Valve: The aortic valve is normal in structure. Aortic valve regurgitation is not visualized. No aortic stenosis is present. Aortic valve mean gradient measures 4.0 mmHg. Aortic valve peak gradient measures 7.0 mmHg. Aortic valve area, by VTI measures 1.74 cm. Pulmonic Valve: The pulmonic valve was normal in structure. Pulmonic valve regurgitation is trivial. No evidence of pulmonic stenosis. Aorta: The aortic root is normal in size and structure. Venous: The inferior vena cava is normal in size with greater than 50% respiratory variability, suggesting right atrial pressure of 3 mmHg. IAS/Shunts: No atrial level shunt detected by color flow Doppler.  LEFT VENTRICLE PLAX 2D LVIDd:         3.30  cm  Diastology LVIDs:         2.30 cm  LV e' medial:    11.10 cm/s LV PW:         0.80 cm  LV E/e' medial:  5.9 LV IVS:        0.90 cm  LV e' lateral:   13.90 cm/s LVOT diam:     1.80 cm  LV E/e' lateral: 4.7 LV SV:         40 LV SV Index:   24 LVOT Area:     2.54 cm  RIGHT VENTRICLE RV Basal diam:  4.40 cm RV Mid diam:    3.30 cm RV S prime:     9.30 cm/s TAPSE (M-mode): 1.6 cm LEFT ATRIUM           Index      RIGHT ATRIUM           Index LA diam:      3.10 cm 1.90 cm/m RA Area:     16.60 cm LA Vol (A4C): 14.5 ml 8.87 ml/m RA Volume:   49.30 ml  30.16 ml/m  AORTIC VALVE AV Area (Vmax):    1.95 cm AV Area (Vmean):   1.70 cm AV Area (VTI):     1.74 cm AV Vmax:           132.00 cm/s AV Vmean:          88.400 cm/s AV VTI:            0.228 m AV Peak Grad:      7.0 mmHg AV Mean Grad:      4.0 mmHg LVOT Vmax:         101.00 cm/s LVOT Vmean:        59.200 cm/s LVOT VTI:          0.156 m LVOT/AV VTI ratio: 0.68  AORTA Ao Root diam: 2.70 cm Ao Asc diam:  2.70 cm MITRAL VALVE MV Area (PHT): 4.36 cm    SHUNTS MV Decel Time: 174 msec    Systemic VTI:  0.16 m MV E  velocity: 65.40 cm/s  Systemic Diam: 1.80 cm MV A velocity: 71.70 cm/s MV E/A ratio:  0.91 Jenkins Rouge MD Electronically signed by Jenkins Rouge MD Signature Date/Time: 04/06/2021/12:51:28 PM    Final    VAS [place] LOWER EXTREMITY VENOUS (DVT)  Result Date: 04/06/2021  Lower Venous DVT Study Patient Name:  SHIVANSHI CHAPUT  Date of Exam:   04/06/2021 Medical Rec #: [id number]     Accession #:    [id number] Date of Birth: 04/28/1981     Patient Gender: F Patient Age:   61 years Exam Location:  [facility] Procedure:      VAS [place] LOWER EXTREMITY VENOUS (DVT) Referring Phys: Gean Birchwood --------------------------------------------------------------------------------  Indications: Pulmonary embolism.  Comparison Study: no prior Performing Technologist: Archie Patten RVS  Examination Guidelines: A complete evaluation includes B-mode imaging, spectral Doppler,  color Doppler, and power Doppler as needed of all accessible portions of each vessel. Bilateral testing is considered an integral part of a complete examination. Limited examinations for reoccurring indications may be performed as noted. The reflux portion of the exam is performed with the patient in reverse Trendelenburg.  +---------+---------------+---------+-----------+----------+--------------+ RIGHT    CompressibilityPhasicitySpontaneityPropertiesThrombus Aging +---------+---------------+---------+-----------+----------+--------------+ CFV      Full           Yes      Yes                                 +---------+---------------+---------+-----------+----------+--------------+ SFJ      Full                                                        +---------+---------------+---------+-----------+----------+--------------+ FV Prox  Full                                                        +---------+---------------+---------+-----------+----------+--------------+ FV Mid   Full                                                        +---------+---------------+---------+-----------+----------+--------------+ FV DistalFull                                                        +---------+---------------+---------+-----------+----------+--------------+ PFV      Full                                                        +---------+---------------+---------+-----------+----------+--------------+ POP      Full           Yes      Yes                                 +---------+---------------+---------+-----------+----------+--------------+  PTV      Full                                                        +---------+---------------+---------+-----------+----------+--------------+ PERO     Full                                                        +---------+---------------+---------+-----------+----------+--------------+    +---------+---------------+---------+-----------+----------+--------------+ LEFT     CompressibilityPhasicitySpontaneityPropertiesThrombus Aging +---------+---------------+---------+-----------+----------+--------------+ CFV      Full           Yes      Yes                                 +---------+---------------+---------+-----------+----------+--------------+ SFJ      Full                                                        +---------+---------------+---------+-----------+----------+--------------+ FV Prox  Full                                                        +---------+---------------+---------+-----------+----------+--------------+ FV Mid   Full                                                        +---------+---------------+---------+-----------+----------+--------------+ FV DistalFull                                                        +---------+---------------+---------+-----------+----------+--------------+ PFV      Full                                                        +---------+---------------+---------+-----------+----------+--------------+ POP      Full           Yes      Yes                                 +---------+---------------+---------+-----------+----------+--------------+ PTV      Full                                                        +---------+---------------+---------+-----------+----------+--------------+  PERO     Full                                                        +---------+---------------+---------+-----------+----------+--------------+     Summary: BILATERAL: - No evidence of deep vein thrombosis seen in the lower extremities, bilaterally. -No evidence of popliteal cyst, bilaterally.   *See table(s) above for measurements and observations. Electronically signed by Deitra Mayo MD on 04/06/2021 at 3:18:12 PM.    Final     Cardiac Studies    Transthoracic  Echocardiogram: Date: 04/06/21 Results: No WMA on contrast studies  1. Left ventricular ejection fraction, by estimation, is 55 to 60%. The  left ventricle has normal function. The left ventricle has no regional  wall motion abnormalities. Left ventricular diastolic parameters were  normal.   2. Right ventricular systolic function is normal. The right ventricular  size is normal.   3. The mitral valve is normal in structure. No evidence of mitral valve  regurgitation. No evidence of mitral stenosis.   4. The aortic valve is normal in structure. Aortic valve regurgitation is  not visualized. No aortic stenosis is present.   5. The inferior vena cava is normal in size with greater than 50%  respiratory variability, suggesting right atrial pressure of 3 mmHg.   CTPE Date: 04/05/21 Results: Minimal LAD calcification; PE noted  IMPRESSION: Small acute appearing right lower lobe pulmonary embolus. Web-like filling defects in the central pulmonary arteries bilaterally may reflect webs from chronic pulmonary emboli.   Patient Profile     40 y.o. 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   Medication Recommendations:  Anticoagulation and atorvastatin 80 mg PO daily Other recommendations (labs, testing, etc):  NA; will need PCP and OB/GYN f/u Follow up as an outpatient:  We will arrange follow up with me  For questions or updates, please contact [place] Please consult [email or web address] for contact info under  Cardiology/STEMI.      Signed, Werner Lean, MD  04/07/2021, 10:38 AM

## 2021-04-21 ENCOUNTER — Telehealth (HOSPITAL_COMMUNITY): Payer: Self-pay

## 2021-04-21 ENCOUNTER — Other Ambulatory Visit (HOSPITAL_COMMUNITY): Payer: Self-pay

## 2021-04-21 NOTE — Telephone Encounter (Signed)
Pharmacy Transitions of Care Follow-up Telephone Call  Date of discharge: acute PE  Discharge Diagnosis: 04/07/21  How have you been since you were released from the hospital?  Patient has been doing well, no questions about meds at this time.  Medication changes made at discharge:     START taking: Eliquis DVT/PE Starter Pack (Apixaban Starter Pack (10mg  and 5mg ))  STOP taking: ibuprofen 200 MG tablet (ADVIL)  NIFEdipine 30 MG 24 hr tablet (ADALAT CC)  oxyCODONE-acetaminophen 5-325 MG tablet (PERCOCET/ROXICET)   Medication changes verified by the patient? Yes    Medication Accessibility:  Home Pharmacy:  not discussed  Was the patient provided with refills on discharged medications? No  Have all prescriptions been transferred from [facility] to home pharmacy?  N/a  Is the patient able to afford medications? Patient has insurance    Medication Review:  APIXABAN (ELIQUIS)  Apixaban 10 mg BID initiated on 04/07/21. Will switch to apixaban 5 mg BID after 7 days (DATE 04/15/21).  - Discussed importance of taking medication around the same time everyday  - Reviewed potential DDIs with patient  - Advised patient of medications to avoid (NSAIDs, ASA)  - Educated that Tylenol (acetaminophen) will be the preferred analgesic to prevent risk of bleeding  - Emphasized importance of monitoring for signs and symptoms of bleeding (abnormal bruising, prolonged bleeding, nose bleeds, bleeding from gums, discolored urine, black tarry stools)  - Advised patient to alert all providers of anticoagulation therapy prior to starting a new medication or having a procedure    Follow-up Appointments:  PCP Hospital f/u appt confirmed?  Scheduled to see Dr. Tonita Cong on 04/16/21 @ IM.   [facility] f/u appt confirmed?  Scheduled to see Dr. Gasper Sells on 06/12/21 @ Cardiology.   If their condition worsens, is the pt aware to call PCP or go to the Emergency Dept.? yes  Final Patient Assessment: Patient  has already had follow up but did not get maintenance refills at f/u. Patient will call office today and ask for maintenance fill of eliquis to be sent to home pharmacy.

## 2021-06-11 NOTE — Progress Notes (Deleted)
Cardiology Office Note:    Date:  06/11/2021   ID:  Sherri Douglas, DOB 1981/04/25, MRN [id number]  PCP:  Rikki Spearing, NP   [facility] Providers Cardiologist:  None { Click to update primary MD,subspecialty MD or APP then REFRESH:1}    Referring MD: Rikki Spearing, NP   CC: Follow up NSTEMI  History of Present Illness:    Sherri Douglas is a 40 y.o. female with a hx of IDA and NSTEMI related to PE with normal Echo 04/06/21.  Seen in follow up 06/12/21.  Patient notes that she is doing ***.   Since day prior/last visit notes *** .  No chest pain or pressure ***.  No SOB/DOE*** and no PND/Orthopnea***.  No weight gain or leg swelling***.  No palpitations or syncope ***.  Ambulatory blood pressure ***.   Past Medical History:  Diagnosis Date   Abnormal Pap smear    Anemia    Chlamydia    Gestational diabetes    Trichomoniasis of vagina 2015   Urinary tract infection     Past Surgical History:  Procedure Laterality Date   INDUCED ABORTION     WISDOM TOOTH EXTRACTION      Current Medications: No outpatient medications have been marked as taking for the 06/12/21 encounter (Appointment) with Werner Lean, MD.     Allergies:   Amoxicillin   Social History   Socioeconomic History   Marital status: Single    Spouse name: Not on file   Number of children: Not on file   Years of education: Not on file   Highest education level: Not on file  Occupational History   Not on file  Tobacco Use   Smoking status: Former   Smokeless tobacco: Never  Substance and Sexual Activity   Alcohol use: Yes    Comment: socially   Drug use: No   Sexual activity: Yes    Birth control/protection: None    Comment: academic advisor  Other Topics Concern   Not on file  Social History Narrative   Not on file   Social Determinants of Health   Financial Resource Strain: Not on file  Food Insecurity: Not on file  Transportation Needs: Not on file  Physical Activity:  Not on file  Stress: Not on file  Social Connections: Not on file     Family History: The patient's ***family history includes Diabetes in her father, paternal aunt, paternal grandfather, and paternal grandmother; Hypertension in her father, paternal aunt, paternal grandfather, and paternal grandmother. There is no history of Anesthesia problems.  ROS:   Please see the history of present illness.    *** All other systems reviewed and are negative.  EKGs/Labs/Other Studies Reviewed:    The following studies were reviewed today: ***  EKG:  EKG is *** ordered today.  The ekg ordered today demonstrates ***  Transthoracic Echocardiogram: Date: 04/06/21 Results: No WMA on contrast studies  1. Left ventricular ejection fraction, by estimation, is 55 to 60%. The  left ventricle has normal function. The left ventricle has no regional  wall motion abnormalities. Left ventricular diastolic parameters were  normal.   2. Right ventricular systolic function is normal. The right ventricular  size is normal.   3. The mitral valve is normal in structure. No evidence of mitral valve  regurgitation. No evidence of mitral stenosis.   4. The aortic valve is normal in structure. Aortic valve regurgitation is  not visualized. No aortic stenosis is present.  5. The inferior vena cava is normal in size with greater than 50%  respiratory variability, suggesting right atrial pressure of 3 mmHg.    CTPE Date: 04/05/21 Results: Minimal LAD calcification; PE noted   IMPRESSION: Small acute appearing right lower lobe pulmonary embolus. Web-like filling defects in the central pulmonary arteries bilaterally may reflect webs from chronic pulmonary emboli.  Recent Labs: 04/05/2021: B Natriuretic Peptide 43.5 04/06/2021: ALT 12; BUN 9; Creatinine, Ser 0.58; Potassium 3.6; Sodium 134 04/07/2021: Hemoglobin 8.8; Platelets 522  Recent Lipid Panel No results found for: CHOL, TRIG, HDL, CHOLHDL, VLDL, LDLCALC,  LDLDIRECT   Physical Exam:    VS:  There were no vitals taken for this visit.    Wt Readings from Last 3 Encounters:  04/07/21 151 lb 14.4 oz (68.9 kg)  04/26/19 161 lb 12.8 oz (73.4 kg)  04/28/16 160 lb (72.6 kg)     Gen: *** distress, *** obese/well nourished/malnourished   Neck: No JVD, *** carotid bruit Ears: *** Frank Sign Cardiac: No Rubs or Gallops, *** Murmur, ***cardia, *** radial pulses Respiratory: Clear to auscultation bilaterally, *** effort, ***  respiratory rate GI: Soft, nontender, non-distended *** MS: No *** edema; *** moves all extremities Integument: Skin feels *** Neuro:  At time of evaluation, alert and oriented to person/place/time/situation *** Psych: Normal affect, patient feels ***  ASSESSMENT:    No diagnosis found. PLAN:    Aortic Atherosclerosis and CAC PE and Tobacco Abuse IDA - ***      {Are you ordering a CV Procedure (e.g. stress test, cath, DCCV, TEE, etc)?   Press F2        :[id number]}    Medication Adjustments/Labs and Tests Ordered: Current medicines are reviewed at length with the patient today.  Concerns regarding medicines are outlined above.  No orders of the defined types were placed in this encounter.  No orders of the defined types were placed in this encounter.   There are no Patient Instructions on file for this visit.   Signed, Werner Lean, MD  06/11/2021 5:26 PM    [place]

## 2021-06-12 ENCOUNTER — Ambulatory Visit: Payer: BC Managed Care – PPO | Admitting: Internal Medicine

## 2021-09-23 ENCOUNTER — Other Ambulatory Visit (HOSPITAL_COMMUNITY): Payer: Self-pay

## 2021-09-24 ENCOUNTER — Other Ambulatory Visit (HOSPITAL_COMMUNITY): Payer: Self-pay

## 2021-10-02 ENCOUNTER — Other Ambulatory Visit (HOSPITAL_COMMUNITY): Payer: Self-pay

## 2021-10-15 ENCOUNTER — Emergency Department (HOSPITAL_COMMUNITY)
Admission: EM | Admit: 2021-10-15 | Discharge: 2021-10-15 | Disposition: A | Discharge status: Home / Self Care | Payer: BC Managed Care – PPO | Attending: Emergency Medicine | Admitting: Emergency Medicine

## 2021-10-15 ENCOUNTER — Other Ambulatory Visit: Payer: Self-pay

## 2021-10-15 ENCOUNTER — Encounter (HOSPITAL_COMMUNITY): Payer: Self-pay | Admitting: *Deleted

## 2021-10-15 ENCOUNTER — Emergency Department (HOSPITAL_COMMUNITY): Payer: BC Managed Care – PPO

## 2021-10-15 DIAGNOSIS — D649 Anemia, unspecified: Secondary | ICD-10-CM | POA: Insufficient documentation

## 2021-10-15 DIAGNOSIS — J181 Lobar pneumonia, unspecified organism: Secondary | ICD-10-CM | POA: Insufficient documentation

## 2021-10-15 DIAGNOSIS — Z20822 Contact with and (suspected) exposure to covid-19: Secondary | ICD-10-CM | POA: Insufficient documentation

## 2021-10-15 DIAGNOSIS — N920 Excessive and frequent menstruation with regular cycle: Secondary | ICD-10-CM | POA: Diagnosis not present

## 2021-10-15 DIAGNOSIS — R531 Weakness: Secondary | ICD-10-CM | POA: Diagnosis present

## 2021-10-15 LAB — CBC WITH DIFFERENTIAL/PLATELET
Abs Immature Granulocytes: 0.05 10*3/uL (ref 0.00–0.07)
Basophils Absolute: 0.1 10*3/uL (ref 0.0–0.1)
Basophils Relative: 1 %
Eosinophils Absolute: 0 10*3/uL (ref 0.0–0.5)
Eosinophils Relative: 0 %
HCT: 27.1 % — ABNORMAL LOW (ref 36.0–46.0)
Hemoglobin: 7.2 g/dL — ABNORMAL LOW (ref 12.0–15.0)
Immature Granulocytes: 1 %
Lymphocytes Relative: 14 %
Lymphs Abs: 1.4 10*3/uL (ref 0.7–4.0)
MCH: 18.4 pg — ABNORMAL LOW (ref 26.0–34.0)
MCHC: 26.6 g/dL — ABNORMAL LOW (ref 30.0–36.0)
MCV: 69.1 fL — ABNORMAL LOW (ref 80.0–100.0)
Monocytes Absolute: 0.3 10*3/uL (ref 0.1–1.0)
Monocytes Relative: 3 %
Neutro Abs: 8.6 10*3/uL — ABNORMAL HIGH (ref 1.7–7.7)
Neutrophils Relative %: 81 %
Platelets: 488 10*3/uL — ABNORMAL HIGH (ref 150–400)
RBC: 3.92 MIL/uL (ref 3.87–5.11)
RDW: 20.7 % — ABNORMAL HIGH (ref 11.5–15.5)
WBC: 10.5 10*3/uL (ref 4.0–10.5)
nRBC: 0 % (ref 0.0–0.2)

## 2021-10-15 LAB — COMPREHENSIVE METABOLIC PANEL
ALT: 10 U/L (ref 0–44)
AST: 20 U/L (ref 15–41)
Albumin: 3.8 g/dL (ref 3.5–5.0)
Alkaline Phosphatase: 45 U/L (ref 38–126)
Anion gap: 10 (ref 5–15)
BUN: 10 mg/dL (ref 6–20)
CO2: 20 mmol/L — ABNORMAL LOW (ref 22–32)
Calcium: 8.6 mg/dL — ABNORMAL LOW (ref 8.9–10.3)
Chloride: 106 mmol/L (ref 98–111)
Creatinine, Ser: 0.63 mg/dL (ref 0.44–1.00)
GFR, Estimated: >60 mL/min (ref >60)
Glucose, Bld: 100 mg/dL — ABNORMAL HIGH (ref 70–99)
Potassium: 4 mmol/L (ref 3.5–5.1)
Sodium: 136 mmol/L (ref 135–145)
Total Bilirubin: 0.2 mg/dL — ABNORMAL LOW (ref 0.3–1.2)
Total Protein: 7.6 g/dL (ref 6.5–8.1)

## 2021-10-15 LAB — I-STAT BETA HCG BLOOD, ED (MC, WL, AP ONLY): I-stat hCG, quantitative: 5.2 m[IU]/mL — ABNORMAL HIGH (ref <5)

## 2021-10-15 LAB — RESP PANEL BY RT-PCR (FLU A&B, COVID) ARPGX2
Influenza A by PCR: NEGATIVE
Influenza B by PCR: NEGATIVE
SARS Coronavirus 2 by RT PCR: NEGATIVE

## 2021-10-15 LAB — CBG MONITORING, ED: Glucose-Capillary: 87 mg/dL (ref 70–99)

## 2021-10-15 LAB — D-DIMER, QUANTITATIVE: D-Dimer, Quant: 0.64 ug/mL-FEU — ABNORMAL HIGH (ref 0.00–0.50)

## 2021-10-15 LAB — PREPARE RBC (CROSSMATCH)

## 2021-10-15 LAB — TROPONIN I (HIGH SENSITIVITY): Troponin I (High Sensitivity): 11 ng/L (ref <18)

## 2021-10-15 MED ORDER — ONDANSETRON HCL 4 MG/2ML IJ SOLN
4.0000 mg | Freq: Once | INTRAMUSCULAR | Status: AC
  Administered 2021-10-15: 4 mg via INTRAVENOUS
  Filled 2021-10-15: qty 2

## 2021-10-15 MED ORDER — IOHEXOL 350 MG/ML SOLN
100.0000 mL | Freq: Once | INTRAVENOUS | Status: AC | PRN
  Administered 2021-10-15: 70 mL via INTRAVENOUS

## 2021-10-15 MED ORDER — SODIUM CHLORIDE 0.9 % IV SOLN: INTRAVENOUS | Status: DC

## 2021-10-15 MED ORDER — SODIUM CHLORIDE 0.9 % IV SOLN
10.0000 mL/h | Freq: Once | INTRAVENOUS | Status: AC
  Administered 2021-10-15: 10 mL/h via INTRAVENOUS

## 2021-10-15 MED ORDER — SODIUM CHLORIDE 0.9 % IV BOLUS
1000.0000 mL | Freq: Once | INTRAVENOUS | Status: AC
  Administered 2021-10-15: 1000 mL via INTRAVENOUS

## 2021-10-15 MED ORDER — DOXYCYCLINE HYCLATE 100 MG PO TABS
100.0000 mg | ORAL_TABLET | Freq: Once | ORAL | Status: AC
  Administered 2021-10-15: 100 mg via ORAL
  Filled 2021-10-15: qty 1

## 2021-10-15 MED ORDER — ALUM & MAG HYDROXIDE-SIMETH 200-200-20 MG/5ML PO SUSP
30.0000 mL | Freq: Once | ORAL | Status: AC
  Administered 2021-10-15: 30 mL via ORAL
  Filled 2021-10-15: qty 30

## 2021-10-15 MED ORDER — DOXYCYCLINE HYCLATE 100 MG PO CAPS: 100.0000 mg | ORAL_CAPSULE | Freq: Two times a day (BID) | ORAL | 0 refills | Status: DC

## 2021-10-15 MED ORDER — MORPHINE SULFATE (PF) 4 MG/ML IV SOLN
4.0000 mg | Freq: Once | INTRAVENOUS | Status: AC
  Administered 2021-10-15: 4 mg via INTRAVENOUS
  Filled 2021-10-15: qty 1

## 2021-10-15 NOTE — ED Notes (Signed)
Pt back from ct, blood draw unsuccessful times one ? ?

## 2021-10-15 NOTE — ED Notes (Signed)
Pt in ct 

## 2021-10-15 NOTE — ED Triage Notes (Signed)
Patient presents to ed via [facility] states she started her menstrual cycle Tues , she always has a heavy flow  c/o abd. Cramping of which she always has, patient has a history of DVT ans was on Eliquis however was taken off in Nov. States as she got to work this morning she just felt very weak  and had a near syncopal episode. Patient is alert and oriented. On arrival ?

## 2021-10-15 NOTE — ED Provider Notes (Signed)
?[facility] ?Provider Note ? ? ?CSN: [id number] ?Arrival date & time: 10/15/21  0950 ? ?  ? ?History ? ?Chief Complaint  ?Patient presents with  ? Near Syncope  ? ? ?Sherri Douglas is a 41 y.o. female. ? 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  She had the DVT and PE in September of 2022.  She said she was not told why she had the PE.  I don't see any hypercoagulable labs done.  She denied recent surgery or long trips prior to that PE.  She already had problems with significant heavy bleeding from her periods, but she said the Eliquis made them horrible.  So, she stopped taking the Eliquis in November.  She started her cycle on Tuesday, 3/14.  It has been heavy, per usual.  She had sob today.  She felt very weak when she got to work and felt like she was going to pass out. ? ? ?  ? ?Home Medications ?Prior to Admission medications   ?Medication Sig Start Date End Date Taking? Authorizing Provider  ?doxycycline (VIBRAMYCIN) 100 MG capsule Take 1 capsule (100 mg total) by mouth 2 (two) times daily. 10/15/21  Yes Isla Pence, MD  ?ibuprofen (ADVIL) 200 MG tablet Take 200-600 mg by mouth every 6 (six) hours as needed for headache, cramping or mild pain.   Yes [provider]  ?Apixaban Starter Pack, '10mg'$  and '5mg'$ , (ELIQUIS DVT/PE STARTER PACK) Take 2 tablets ('10mg'$ ) twice daily for 7 days, then 1 tablet ('5mg'$ ) twice daily ?Patient not taking: Reported on 10/15/2021 04/07/21   Flora Lipps, MD  ?   ? ?Allergies    ?Eliquis [apixaban] and Amoxicillin   ? ?Review of Systems   ?Review of Systems  ?Respiratory:  Positive for shortness of breath.   ?Genitourinary:  Positive for vaginal bleeding.  ?Neurological:  Positive for weakness.  ?All other systems reviewed and are negative. ? ?Physical Exam ?Updated Vital Signs ?BP (!) 146/75   Pulse 75   Temp 98.4 ?F (36.9 ?C) (Oral)   Resp 18   Ht '4\' 11"'$  (1.499 m)   Wt 68 kg   SpO2 100%   BMI 30.30  kg/m?  ?Physical Exam ?Vitals and nursing note reviewed.  ?Constitutional:   ?   Appearance: Normal appearance.  ?HENT:  ?   Head: Normocephalic and atraumatic.  ?   Right Ear: External ear normal.  ?   Left Ear: External ear normal.  ?   Nose: Nose normal.  ?   Mouth/Throat:  ?   Mouth: Mucous membranes are moist.  ?   Pharynx: Oropharynx is clear.  ?Eyes:  ?   Extraocular Movements: Extraocular movements intact.  ?   Conjunctiva/sclera: Conjunctivae normal.  ?   Pupils: Pupils are equal, round, and reactive to light.  ?Cardiovascular:  ?   Rate and Rhythm: Normal rate and regular rhythm.  ?   Pulses: Normal pulses.  ?   Heart sounds: Normal heart sounds.  ?Pulmonary:  ?   Effort: Pulmonary effort is normal.  ?   Breath sounds: Normal breath sounds.  ?Abdominal:  ?   General: Abdomen is flat. Bowel sounds are normal.  ?   Palpations: Abdomen is soft.  ?Musculoskeletal:     ?   General: Normal range of motion.  ?   Cervical back: Normal range of motion and neck supple.  ?Skin: ?   General: Skin is  warm.  ?   Capillary Refill: Capillary refill takes less than 2 seconds.  ?Neurological:  ?   General: No focal deficit present.  ?   Mental Status: She is alert and oriented to person, place, and time.  ?Psychiatric:     ?   Mood and Affect: Mood normal.     ?   Behavior: Behavior normal.  ? ? ?ED Results / Procedures / Treatments   ?Labs ?(all labs ordered are listed, but only abnormal results are displayed) ?Labs Reviewed  ?CBC WITH DIFFERENTIAL/PLATELET - Abnormal; Notable for the following components:  ?    Result Value  ? Hemoglobin 7.2 (*)   ? HCT 27.1 (*)   ? MCV 69.1 (*)   ? MCH 18.4 (*)   ? MCHC 26.6 (*)   ? RDW 20.7 (*)   ? Platelets 488 (*)   ? Neutro Abs 8.6 (*)   ? All other components within normal limits  ?COMPREHENSIVE METABOLIC PANEL - Abnormal; Notable for the following components:  ? CO2 20 (*)   ? Glucose, Bld 100 (*)   ? Calcium 8.6 (*)   ? Total Bilirubin 0.2 (*)   ? All other components within  normal limits  ?D-DIMER, QUANTITATIVE - Abnormal; Notable for the following components:  ? D-Dimer, Quant 0.64 (*)   ? All other components within normal limits  ?I-STAT BETA HCG BLOOD, ED ([facility], [facility], [facility] ONLY) - Abnormal; Notable for the following components:  ? I-stat hCG, quantitative 5.2 (*)   ? All other components within normal limits  ?RESP PANEL BY RT-PCR (FLU A&B, COVID) ARPGX2  ?URINALYSIS, ROUTINE W REFLEX MICROSCOPIC  ?CBG MONITORING, ED  ?PREPARE RBC (CROSSMATCH)  ?TYPE AND SCREEN  ?TROPONIN I (HIGH SENSITIVITY)  ?TROPONIN I (HIGH SENSITIVITY)  ? ? ?EKG ?EKG Interpretation ? ?Date/Time:  Thursday October 15 2021 09:57:14 EDT ?Ventricular Rate:  77 ?PR Interval:  127 ?QRS Duration: 82 ?QT Interval:  384 ?QTC Calculation: 435 ?R Axis:   63 ?Text Interpretation: Sinus rhythm Since last tracing rate slower Confirmed by Isla Pence [id number]) on 10/15/2021 10:00:28 AM ? ?Radiology ?CT Angio Chest PE W and/or Wo Contrast ? ?Result Date: 10/15/2021 ?CLINICAL DATA:  Pulmonary embolism (PE) suspected, high prob EXAM: CT ANGIOGRAPHY CHEST WITH CONTRAST TECHNIQUE: Multidetector CT imaging of the chest was performed using the standard protocol during bolus administration of intravenous contrast. Multiplanar CT image reconstructions and MIPs were obtained to evaluate the vascular anatomy. RADIATION DOSE REDUCTION: This exam was performed according to the departmental dose-optimization program which includes automated exposure control, adjustment of the mA and/or kV according to patient size and/or use of iterative reconstruction technique. CONTRAST:  55m OMNIPAQUE IOHEXOL 350 MG/ML SOLN COMPARISON:  CT chest 04/05/2021 FINDINGS: Cardiovascular: Satisfactory opacification of the pulmonary arteries to the segmental level. No evidence of pulmonary embolism. Normal heart size. No pericardial effusion. Five pulmonary veins, 3 on the right and 2 on the left. Mediastinum/Nodes: No lymphadenopathy. The thyroid is unremarkable.  The esophagus is unremarkable. Trachea is unremarkable. Lungs/Pleura: There is a small focal subpleural opacity in the superior segment of the right lower lobe (series 6, image 63). No pleural effusion. No pneumothorax. Upper Abdomen: No acute abnormality. Musculoskeletal: No chest wall abnormality. No acute or significant osseous findings. Review of the MIP images confirms the above findings. IMPRESSION: No evidence of pulmonary embolism. Focal subpleural opacity with adjacent ground-glass in the posterior aspect of the right lower lobe superior segment. This could represent focal atelectasis or  developing infection. Electronically Signed   By: Maurine Simmering M.D.   On: 10/15/2021 13:13   ? ?Procedures ?Procedures  ? ? ?Medications Ordered in ED ?Medications  ?sodium chloride 0.9 % bolus 1,000 mL (0 mLs Intravenous Stopped 10/15/21 1304)  ?  And  ?0.9 %  sodium chloride infusion ( Intravenous New Bag/Given 10/15/21 1059)  ?doxycycline (VIBRA-TABS) tablet 100 mg (has no administration in time range)  ?0.9 %  sodium chloride infusion (10 mL/hr Intravenous New Bag/Given 10/15/21 1329)  ?iohexol (OMNIPAQUE) 350 MG/ML injection 100 mL (70 mLs Intravenous Contrast Given 10/15/21 1301)  ?alum & mag hydroxide-simeth (MAALOX/MYLANTA) 200-200-20 MG/5ML suspension 30 mL (30 mLs Oral Given 10/15/21 1426)  ?morphine (PF) 4 MG/ML injection 4 mg (4 mg Intravenous Given 10/15/21 1451)  ?ondansetron (ZOFRAN) injection 4 mg (4 mg Intravenous Given 10/15/21 1449)  ? ? ?ED Course/ Medical Decision Making/ A&P ?  ?                        ?Medical Decision Making ?Amount and/or Complexity of Data Reviewed ?Labs: ordered. ?Radiology: ordered. ?ECG/medicine tests: ordered. ? ?Risk ?OTC drugs. ?Prescription drug management. ? ? ?This patient presents to the ED for concern of syncope, this involves an extensive number of treatment options, and is a complaint that carries with it a high risk of complications and morbidity.  The differential diagnosis  includes anemia, PE, cardiac event, electrolyte abn, infection ? ? ?Co morbidities that complicate the patient evaluation ? ?anemia from menorrhagia, PE and DVT ? ? ?Additional history obtained: ? ?Additional history

## 2021-10-15 NOTE — ED Notes (Signed)
Pt in bed, pt denies sob, denies itchiness or pain at iv site, pt stable after first 15 mins of blood admin, increased blood gtt to infuse over aprox one hour per md instructions.  Pt remains on O2 sat and cardiac monitor.  Call bell within reach.   ?

## 2021-10-16 LAB — TYPE AND SCREEN
ABO/RH(D): B POS
Antibody Screen: NEGATIVE
Unit division: 0

## 2021-10-16 LAB — BPAM RBC
Blood Product Expiration Date: 202304142359
ISSUE DATE / TIME: 202303161319
Unit Type and Rh: 7300

## 2022-07-05 ENCOUNTER — Emergency Department (HOSPITAL_COMMUNITY)
Admission: EM | Admit: 2022-07-05 | Discharge: 2022-07-05 | Disposition: A | Discharge status: Home / Self Care | Payer: BC Managed Care – PPO | Attending: Emergency Medicine | Admitting: Emergency Medicine

## 2022-07-05 ENCOUNTER — Other Ambulatory Visit: Payer: Self-pay

## 2022-07-05 ENCOUNTER — Emergency Department (HOSPITAL_COMMUNITY): Payer: BC Managed Care – PPO

## 2022-07-05 ENCOUNTER — Encounter (HOSPITAL_COMMUNITY): Payer: Self-pay | Admitting: Emergency Medicine

## 2022-07-05 DIAGNOSIS — R42 Dizziness and giddiness: Secondary | ICD-10-CM | POA: Diagnosis present

## 2022-07-05 DIAGNOSIS — D649 Anemia, unspecified: Secondary | ICD-10-CM | POA: Insufficient documentation

## 2022-07-05 DIAGNOSIS — Z7901 Long term (current) use of anticoagulants: Secondary | ICD-10-CM | POA: Insufficient documentation

## 2022-07-05 LAB — CBC WITH DIFFERENTIAL/PLATELET
Abs Immature Granulocytes: 0.03 10*3/uL (ref 0.00–0.07)
Basophils Absolute: 0.1 10*3/uL (ref 0.0–0.1)
Basophils Relative: 1 %
Eosinophils Absolute: 0.1 10*3/uL (ref 0.0–0.5)
Eosinophils Relative: 1 %
HCT: 23.4 % — ABNORMAL LOW (ref 36.0–46.0)
Hemoglobin: 6.1 g/dL — CL (ref 12.0–15.0)
Immature Granulocytes: 0 %
Lymphocytes Relative: 31 %
Lymphs Abs: 2.5 10*3/uL (ref 0.7–4.0)
MCH: 18 pg — ABNORMAL LOW (ref 26.0–34.0)
MCHC: 26.1 g/dL — ABNORMAL LOW (ref 30.0–36.0)
MCV: 69.2 fL — ABNORMAL LOW (ref 80.0–100.0)
Monocytes Absolute: 0.5 10*3/uL (ref 0.1–1.0)
Monocytes Relative: 6 %
Neutro Abs: 4.9 10*3/uL (ref 1.7–7.7)
Neutrophils Relative %: 61 %
Platelets: 812 10*3/uL — ABNORMAL HIGH (ref 150–400)
RBC: 3.38 MIL/uL — ABNORMAL LOW (ref 3.87–5.11)
RDW: 23.9 % — ABNORMAL HIGH (ref 11.5–15.5)
WBC: 8.1 10*3/uL (ref 4.0–10.5)
nRBC: 0.6 % — ABNORMAL HIGH (ref 0.0–0.2)

## 2022-07-05 LAB — BASIC METABOLIC PANEL
Anion gap: 4 — ABNORMAL LOW (ref 5–15)
BUN: 8 mg/dL (ref 6–20)
CO2: 22 mmol/L (ref 22–32)
Calcium: 8.4 mg/dL — ABNORMAL LOW (ref 8.9–10.3)
Chloride: 111 mmol/L (ref 98–111)
Creatinine, Ser: 0.55 mg/dL (ref 0.44–1.00)
GFR, Estimated: >60 mL/min (ref >60)
Glucose, Bld: 95 mg/dL (ref 70–99)
Potassium: 4.2 mmol/L (ref 3.5–5.1)
Sodium: 137 mmol/L (ref 135–145)

## 2022-07-05 LAB — PROTIME-INR
INR: 1 (ref 0.8–1.2)
Prothrombin Time: 13.3 seconds (ref 11.4–15.2)

## 2022-07-05 LAB — FOLATE: Folate: 7 ng/mL (ref >5.9)

## 2022-07-05 LAB — PREPARE RBC (CROSSMATCH)

## 2022-07-05 LAB — RETICULOCYTES
Immature Retic Fract: 28.1 % — ABNORMAL HIGH (ref 2.3–15.9)
RBC.: 3.33 MIL/uL — ABNORMAL LOW (ref 3.87–5.11)
Retic Count, Absolute: 65.3 10*3/uL (ref 19.0–186.0)
Retic Ct Pct: 2 % (ref 0.4–3.1)

## 2022-07-05 LAB — FERRITIN: Ferritin: 3 ng/mL — ABNORMAL LOW (ref 11–307)

## 2022-07-05 LAB — IRON AND TIBC
Iron: 15 ug/dL — ABNORMAL LOW (ref 28–170)
Saturation Ratios: 2 % — ABNORMAL LOW (ref 10.4–31.8)
TIBC: 657 ug/dL — ABNORMAL HIGH (ref 250–450)
UIBC: 642 ug/dL

## 2022-07-05 LAB — TROPONIN I (HIGH SENSITIVITY)
Troponin I (High Sensitivity): 10 ng/L (ref <18)
Troponin I (High Sensitivity): 10 ng/L (ref <18)

## 2022-07-05 LAB — POC OCCULT BLOOD, ED: Fecal Occult Bld: NEGATIVE

## 2022-07-05 LAB — VITAMIN B12: Vitamin B-12: 154 pg/mL — ABNORMAL LOW (ref 180–914)

## 2022-07-05 MED ORDER — SODIUM CHLORIDE 0.9 % IV SOLN: 10.0000 mL/h | Freq: Once | INTRAVENOUS | Status: DC

## 2022-07-05 MED ORDER — DIPHENHYDRAMINE HCL 25 MG PO CAPS
25.0000 mg | ORAL_CAPSULE | Freq: Once | ORAL | Status: AC
  Administered 2022-07-05: 25 mg via ORAL
  Filled 2022-07-05: qty 1

## 2022-07-05 MED ORDER — FAMOTIDINE 20 MG PO TABS
20.0000 mg | ORAL_TABLET | Freq: Once | ORAL | Status: AC
  Administered 2022-07-05: 20 mg via ORAL
  Filled 2022-07-05: qty 1

## 2022-07-05 MED ORDER — ACETAMINOPHEN 500 MG PO TABS
1000.0000 mg | ORAL_TABLET | Freq: Once | ORAL | Status: AC
  Administered 2022-07-05: 1000 mg via ORAL
  Filled 2022-07-05: qty 2

## 2022-07-05 NOTE — ED Triage Notes (Signed)
Pt reports heavy menstrual cycles with hx of previous transfusions. Blood work today at office showed hgb of 5 and patient sent here for transfusion. Endorses fatigue, headache, dizziness, and weakness.

## 2022-07-05 NOTE — ED Provider Notes (Signed)
[facility] EMERGENCY DEPARTMENT Provider Note   CSN: [id number] Arrival date & time: 07/05/22  1140     History  Chief Complaint  Patient presents with   Abnormal Lab    Sherri Douglas is a 41 y.o. female with past medical history significant for iron deficient anemia, heavy menstrual cycle, prior provoked DVT that was treated with Eliquis and discontinued (has not taken Eliquis since 06/2021) presenting with lightheadedness and dizziness with ambulation, concern for anemia.  Patient reports that she has been followed for her anemia for quite some time and began to feel "symptoms of anemia."  She describes these as feeling like she might fall over while she is walking, having difficulty with stairs and carrying her child, feeling exhausted.  She denies fevers, cough, congestion, nausea, vomiting, diarrhea.  She states she completed her menstrual cycle yesterday and that it was 5 days.  She states that it was heavy as it usually is.  She discussed her symptoms and concerns with her PCP who did labs.  Hemoglobin with her PCP was reportedly near 5.  She was instructed to present to the emergency department.  She states at rest she feels well.  She states she has had no hematuria, hematochezia, hematemesis.  She states she may have had some intermittent dark stools.  She denies abdominal pain.       Home Medications Prior to Admission medications   Medication Sig Start Date End Date Taking? Authorizing Provider  Apixaban Starter Pack, '10mg'$  and '5mg'$ , (ELIQUIS DVT/PE STARTER PACK) Take 2 tablets ('10mg'$ ) twice daily for 7 days, then 1 tablet ('5mg'$ ) twice daily Patient not taking: Reported on 10/15/2021 04/07/21   Pokhrel, Corrie Mckusick, MD  doxycycline (VIBRAMYCIN) 100 MG capsule Take 1 capsule (100 mg total) by mouth 2 (two) times daily. 10/15/21   Isla Pence, MD  ibuprofen (ADVIL) 200 MG tablet Take 200-600 mg by mouth every 6 (six) hours as needed for headache, cramping or mild pain.     [provider]      Allergies    Eliquis [apixaban] and Amoxicillin    Review of Systems   Review of Systems  Physical Exam Updated Vital Signs BP (!) 165/97   Pulse 84   Temp 98.6 F (37 C) (Oral)   Resp 15   Ht '4\' 11"'$  (1.499 m)   Wt 68 kg   LMP 07/04/2022 (Exact Date)   SpO2 100%   BMI 30.28 kg/m  Physical Exam Vitals and nursing note reviewed. Exam conducted with a chaperone present.  Constitutional:      General: She is not in acute distress.    Appearance: She is not ill-appearing, toxic-appearing or diaphoretic.  HENT:     Head: Normocephalic and atraumatic.     Nose: Nose normal.  Eyes:     Conjunctiva/sclera: Conjunctivae normal.  Cardiovascular:     Rate and Rhythm: Normal rate and regular rhythm.     Pulses: Normal pulses.     Heart sounds: Normal heart sounds.  Pulmonary:     Effort: Pulmonary effort is normal.     Breath sounds: Normal breath sounds.  Abdominal:     General: There is no distension.     Tenderness: There is no abdominal tenderness. There is no guarding or rebound.  Genitourinary:    Rectum: Normal. Guaiac result negative (light brown stool).  Musculoskeletal:     Right lower leg: No edema.     Left lower leg: No edema.  Skin:  Findings: No rash.  Neurological:     Mental Status: She is alert and oriented to person, place, and time.     ED Results / Procedures / Treatments   Labs (all labs ordered are listed, but only abnormal results are displayed) Labs Reviewed  BASIC METABOLIC PANEL - Abnormal; Notable for the following components:      Result Value   Calcium 8.4 (*)    Anion gap 4 (*)    All other components within normal limits  CBC WITH DIFFERENTIAL/PLATELET - Abnormal; Notable for the following components:   RBC 3.38 (*)    Hemoglobin 6.1 (*)    HCT 23.4 (*)    MCV 69.2 (*)    MCH 18.0 (*)    MCHC 26.1 (*)    RDW 23.9 (*)    Platelets 812 (*)    nRBC 0.6 (*)    All other components within normal  limits  VITAMIN B12 - Abnormal; Notable for the following components:   Vitamin B-12 154 (*)    All other components within normal limits  IRON AND TIBC - Abnormal; Notable for the following components:   Iron 15 (*)    TIBC 657 (*)    Saturation Ratios 2 (*)    All other components within normal limits  FERRITIN - Abnormal; Notable for the following components:   Ferritin 3 (*)    All other components within normal limits  RETICULOCYTES - Abnormal; Notable for the following components:   RBC. 3.33 (*)    Immature Retic Fract 28.1 (*)    All other components within normal limits  PROTIME-INR  FOLATE  POC OCCULT BLOOD, ED  TYPE AND SCREEN  PREPARE RBC (CROSSMATCH)  TROPONIN I (HIGH SENSITIVITY)  TROPONIN I (HIGH SENSITIVITY)    EKG None  Radiology DG Chest 2 View  Result Date: 07/05/2022 CLINICAL DATA:  Chest pain, shortness of breath EXAM: CHEST - 2 VIEW COMPARISON:  04/05/2021 FINDINGS: Cardiac and mediastinal contours are within normal limits. No focal pulmonary opacity. No pleural effusion or pneumothorax. No acute osseous abnormality. IMPRESSION: No acute cardiopulmonary process. Electronically Signed   By: Merilyn Baba M.D.   On: 07/05/2022 12:31    Procedures Procedures    Medications Ordered in ED Medications  0.9 %  sodium chloride infusion (0 mL/hr Intravenous Hold 07/05/22 1521)  acetaminophen (TYLENOL) tablet 1,000 mg (1,000 mg Oral Given 07/05/22 1533)  diphenhydrAMINE (BENADRYL) capsule 25 mg (25 mg Oral Given 07/05/22 1613)  famotidine (PEPCID) tablet 20 mg (20 mg Oral Given 07/05/22 1612)    ED Course/ Medical Decision Making/ A&P                           Medical Decision Making Patient presents as above.  Guaiac negative, normal rectal exam.  Hemoglobin on initial check care 6.1.  Patient has symptomatic anemia.  Her abdominal exam is benign, she has known anemia as well as known heavy menstrual cycles.  She is not currently menstruating.  No obvious  signs of bleeding.  I think this is likely symptomatic anemia in the setting of likely iron deficient anemia.  Patient is currently hemodynamically stable.  Due to her symptomatic nature and the low level of her hemoglobin, she merits transfusion.  Patient has previously been transfused.  She is in agreement with transfusion.  Amount and/or Complexity of Data Reviewed Labs: ordered. Decision-making details documented in ED Course.  Risk OTC drugs. Prescription drug  management.   Troponin negative x 2.  BMP with calcium 8.4, anion gap 4, otherwise within normal limits.  CBC with normal WBC, unremarkable differential.  Platelets elevated at 812.  Hemoglobin 6.1 with low MCH, low MCV, elevated RDW, consistent with iron deficient anemia.  Patient will require transfusion.  Transfusion labs and order set placed.  Plan at time of signout to reevaluate patient following transfusion.  Should she have resolution of her symptoms and no new symptoms, she will likely be stable for discharge.  Should she have any persistent symptoms, she may require admission for further workup.  Please see oncoming providers note for the remainder patient care.  Care was handed off to Dani Gobble, MD        Final Clinical Impression(s) / ED Diagnoses Final diagnoses:  Symptomatic anemia    Rx / DC Orders ED Discharge Orders     None         Luster Landsberg, MD 07/05/22 [place], [place], DO 07/06/22 [id number]

## 2022-07-05 NOTE — ED Notes (Signed)
Pt states she is having indigestion, difficulty swallowing, and ShOB. States that she has had this with previous blood transfusions. MD Vanita Panda made aware.

## 2022-07-05 NOTE — ED Provider Notes (Signed)
  Physical Exam  BP (!) 154/86 (BP Location: Right Arm)   Pulse 80   Temp 98.6 F (37 C) (Oral)   Resp 13   Ht '4\' 11"'$  (1.499 m)   Wt 68 kg   LMP 07/04/2022 (Exact Date)   SpO2 100%   BMI 30.28 kg/m   Physical Exam Vitals and nursing note reviewed.  Constitutional:      General: She is not in acute distress.    Appearance: She is well-developed.  HENT:     Head: Normocephalic and atraumatic.  Eyes:     Conjunctiva/sclera: Conjunctivae normal.  Cardiovascular:     Rate and Rhythm: Normal rate and regular rhythm.     Heart sounds: No murmur heard. Pulmonary:     Effort: Pulmonary effort is normal. No respiratory distress.     Breath sounds: Normal breath sounds.  Abdominal:     Palpations: Abdomen is soft.     Tenderness: There is no abdominal tenderness.  Musculoskeletal:        General: No swelling.     Cervical back: Neck supple.  Skin:    General: Skin is warm and dry.     Capillary Refill: Capillary refill takes less than 2 seconds.  Neurological:     Mental Status: She is alert.  Psychiatric:        Mood and Affect: Mood normal.     Procedures  Procedures  ED Course / MDM    Medical Decision Making Amount and/or Complexity of Data Reviewed Labs: ordered.  Risk OTC drugs. Prescription drug management.   Patient's presentation is most consistent with acute presentation with potential threat to life or bodily function.  The patient is a 41 year old female past medical history of iron deficiency anemia, heavy menstrual cycles, provoked DVT (previously on Eliquis) presenting for anemia.  The patient has reportedly required Venofer infusions in the past for her anemia.  The patient's diagnostic workup included a ferritin which was low at 3; iron low at 15, TIBC elevated at 652; B12 low at 154; type and screen; BMP without significant abnormality; CMP with white blood cell count of 8.1 and hemoglobin of 6.1.  The patient was ordered 2 units of PRBCs by the  previous team.  Approximately 1 hour into the transfusion of the first unit, the patient began reporting sensation of acid reflux in her chest with associated shortness of breath.  I went to bedside and reevaluated patient she was not hypoxic, tachycardic, tachypneic, or febrile at this time.  She stated that this happened before in the past during transfusions and that her symptoms were improved with Pepcid.  Patient was given a dose of Pepcid and Benadryl and on reassessment reported resolution of her symptoms.  The patient received 2 units of PRBCs and was reassessed upon completion of the transfusion.  She reported improvement in her symptoms and was discharged with instructions to follow-up with her PCP in 24 hours for a repeat CBC and reevaluation.  She was given strict return precautions to the emergency department.    Dani Gobble, MD 07/05/22 2325    Carmin Muskrat, MD 07/06/22 2139

## 2022-07-05 NOTE — ED Provider Triage Note (Signed)
Emergency Medicine Provider Triage Evaluation Note  Sherri Douglas , a 41 y.o. female  was evaluated in triage.  Pt complains of low hemoglobin.  Patient states that she was seen at a primary care office today and her hemoglobin came back at 5.  Patient states that over the weekend she has been feeling short of breath, had mild chest pain, and has felt lightheaded at times.  She states that she has been admitted twice in the past year for anemia requiring transfusions. She endorses heavy menstrual cycles with her most recent cycle ending yesterday.  She states that they have recommended that she have a hysterectomy due to heavy bleeding which they believe is causing her anemia. Review of Systems  Positive: As above Negative: As above  Physical Exam  There were no vitals taken for this visit. Gen:   Awake, no distress   Resp:  Normal effort  MSK:   Moves extremities without difficulty  Other:    Medical Decision Making  Medically screening exam initiated at 12:04 PM.  Appropriate orders placed.  Evah Rashid was informed that the remainder of the evaluation will be completed by another provider, this initial triage assessment does not replace that evaluation, and the importance of remaining in the ED until their evaluation is complete.     Dorothyann Peng, PA-C 07/05/22 1209

## 2022-07-05 NOTE — ED Provider Notes (Signed)
Patient here with abnormal lab.  Supervise resident visit.  History of iron deficiency anemia requiring blood transfusion and iron transfusions in the past.  She is having some extreme fatigue and shortness of breath the last week or so.  Blood work done at outpatient office today and hemoglobin was in the 5 range.  She denies any black or bloody stools.  She used to be on a blood thinner in the past for blood clot but she is no longer on blood thinners.  She denies any nausea or vomiting.  Nothing makes it worse or better.  Vital signs are normal.  She is well-appearing.  Hemoccult is negative.  Stool grossly brown.  Hemoglobin on our check is 6.1.  MCV less than 80.  Overall this appears to be consistent with iron deficiency anemia.  She says that she still has fairly heavy and irregular menstrual cycles which has been the cause of her anemia in the past.  She is not having any active bleeding at this time.  Menstrual cycle finished earlier this week.  Ultimately decision was made to type and transfuse 2 units of packed red blood cells to help with her symptoms.  She prefers not to be admitted and overall I think that is okay to transfuse her and reevaluate her and make sure she is feeling better and anticipate that she can be safely discharged home with primary care follow-up.  She will likely need to rearrange for transfusions/iron transfusions in the future.  Patient handed off to oncoming ED staff.  I do not believe there is any acute bleeding going on at this time.  This chart was dictated using voice recognition software.  Despite best efforts to proofread,  errors can occur which can change the documentation meaning.   .Critical Care  Performed by: Lennice Sites, DO Authorized by: Lennice Sites, DO   Critical care provider statement:    Critical care time (minutes):  40   Critical care was necessary to treat or prevent imminent or life-threatening deterioration of the following conditions:  symptomatic anemia, needing transfusion.   Critical care was time spent personally by me on the following activities:  Blood draw for specimens, development of treatment plan with patient or surrogate, evaluation of patient's response to treatment, examination of patient, obtaining history from patient or surrogate, ordering and performing treatments and interventions, ordering and review of laboratory studies, ordering and review of radiographic studies, pulse oximetry, re-evaluation of patient's condition and review of old charts   I assumed direction of critical care for this patient from another provider in my specialty: no       Lennice Sites, DO 07/05/22 1539

## 2022-07-05 NOTE — ED Notes (Signed)
Pt able to swallow medications without difficulty.

## 2022-07-05 NOTE — Discharge Instructions (Signed)
While in the emergency department he was found to have a hemoglobin of 6.1.  He would received transfusion of 2 units of packed red blood cells.  Please follow-up with your primary care doctor within 24 hours for reevaluation and follow-up blood work.  Please return to the emergency department immediately if there is any additional heavy bleeding, if you develop weakness, dizziness, or shortness of breath.

## 2022-07-06 LAB — BPAM RBC
Blood Product Expiration Date: 202312312359
Blood Product Expiration Date: 202312312359
ISSUE DATE / TIME: 202312041439
ISSUE DATE / TIME: 202312041953
Unit Type and Rh: 7300
Unit Type and Rh: 7300

## 2022-07-06 LAB — TYPE AND SCREEN
ABO/RH(D): B POS
Antibody Screen: NEGATIVE
Unit division: 0
Unit division: 0

## 2022-07-13 ENCOUNTER — Telehealth: Payer: Self-pay | Admitting: Oncology

## 2022-07-13 NOTE — Progress Notes (Unsigned)
[facility] Initial Visit:  Patient Care Team: Rikki Spearing, NP as PCP - General (Nurse Practitioner) Heath Lark, MD as Consulting Physician (Hematology and Oncology) Christophe Louis, MD as Consulting Physician (Obstetrics and Gynecology)  CHIEF COMPLAINTS/PURPOSE OF CONSULTATION:  Oncology History   No history exists.    HISTORY OF PRESENTING ILLNESS: Sherri Douglas 41 y.o. female is here because of history of VTE and anemia Medical history notable for uterine fibroids, DVT and pulmonary embolism  September 08, 2015: Transvaginal ultrasound demonstrated multiple fibroids in the uterus  April 05, 2022: CT PA showed small acute appearing right lower lobe pulmonary embolism.  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  Reticulocyte count 2% INR 1.0 Ferritin 3 folate 7 B12 154 Fecal call blood test negative. Received 2 units of packed red blood cells and discharged home  July 17 2022:  [facility] Hematology Consult  With regard to the PE, patient reports that she had sudden onset of SOB which occurred after sitting at the hair dresser.  Ultimately presented to the hospital where she was found to have a PE.  She already had a history of menorrhagia which was exacerbated by Eliquis.   Prior to the diagnosis of PE, she had not been ill, nor had she experienced swelling in extremities.  No recent  surgeries.    Patient is G3 P3 Menopause not reached.  Menses occur monthly last 5 days and are regular.  Bleeding is heavy. Does not have bleeding between periods.    Last pregnancy was 6 yrs ago.  Patient has uterine fibroids.   Has taken oral iron complicated by constipation.  Received IV iron October 2023 while hospitalized and received PRBC's then.    No reaction to IV iron.  Has a normal diet.  No history of postpartum hemorrhage requiring transfusion.  No history hemorrhage postoperatively requiring transfusion.   No hematochezia, melena, hemoptysis, hematuria.   No history of intra-articular or soft tissue bleeding.  No as history of abnormal bleeding in family members    Patient has symptoms of intolerance to cold, fatigue.  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    Has undergone endometrial ablation without improvement in uterine bleeding.   Review of Systems - Oncology  MEDICAL HISTORY: Past Medical History:  Diagnosis Date   Abnormal Pap smear    Anemia    Chlamydia    Gestational diabetes    Trichomoniasis of vagina 2015   Urinary tract infection     SURGICAL HISTORY: Past Surgical History:  Procedure Laterality Date   INDUCED ABORTION     WISDOM TOOTH EXTRACTION      SOCIAL HISTORY: Social History   Socioeconomic History   Marital status: Single    Spouse name: Not on file   Number of children: Not on file   Years of education: Not on file   Highest education level: Not on file  Occupational History   Not on file  Tobacco Use   Smoking status: Former   Smokeless tobacco: Never  Substance and Sexual Activity   Alcohol use: Yes    Comment: socially   Drug use: No   Sexual activity: Yes    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  Other Topics Concern   Not on file  Social History Narrative   Not on file   Social Determinants of Health   Financial Resource Strain: Not on file  Food Insecurity: Not on file  Transportation Needs: Not on file   Physical Activity: Not on file  Stress: Not on file  Social Connections: Not on file  Intimate Partner Violence: Not on file    FAMILY HISTORY Family History  Problem Relation Age of Onset   Diabetes Father    Hypertension Father    Diabetes Paternal Aunt    Hypertension Paternal Aunt    Diabetes Paternal Grandmother    Hypertension Paternal Grandmother    Diabetes Paternal Grandfather    Hypertension Paternal Grandfather    Anesthesia problems Neg Hx     ALLERGIES:  is allergic to eliquis [apixaban] and amoxicillin.  MEDICATIONS:  Current Outpatient Medications  Medication Sig Dispense Refill   ibuprofen (ADVIL) 200 MG tablet Take 200-600 mg by mouth every 6 (six) hours as needed for headache, cramping or mild pain.     iron polysaccharides (NIFEREX) 150 MG capsule Take 150 mg by mouth.     Apixaban Starter Pack, '10mg'$  and '5mg'$ , (ELIQUIS DVT/PE STARTER PACK) Take 2 tablets ('10mg'$ ) twice daily for 7 days, then 1 tablet ('5mg'$ ) twice daily (Patient not taking: Reported on 10/15/2021) 74 tablet 2   doxycycline (VIBRAMYCIN) 100 MG capsule Take 1 capsule (100 mg total) by mouth 2 (two) times daily. (Patient not taking: Reported on 07/17/2022) 14 capsule 0   No current facility-administered medications for this visit.    PHYSICAL EXAMINATION:  ECOG PERFORMANCE STATUS: 1 - Symptomatic but completely ambulatory   Vitals:   07/17/22 1140  BP: (!) 160/95  Pulse: 78  Resp: 17  Temp: 97.9 F (36.6 C)  SpO2: 100%    Filed Weights   07/17/22 1140  Weight: 156 lb 1.6 oz (70.8 kg)     Physical Exam Vitals and nursing note reviewed.  Constitutional:      General: She is not in acute distress.    Appearance: Normal appearance. She is obese. She is not ill-appearing, toxic-appearing or diaphoretic.     Comments: Here alone  HENT:     Head: Normocephalic and atraumatic.     Right Ear: External ear normal.     Left Ear: External ear normal.     Nose: Nose normal. No congestion  or rhinorrhea.  Eyes:     General: No scleral icterus.    Extraocular Movements: Extraocular movements intact.     Conjunctiva/sclera: Conjunctivae normal.     Pupils: Pupils are equal, round, and reactive to light.  Cardiovascular:     Rate and Rhythm: Normal rate and regular rhythm.     Heart sounds: Normal heart sounds. No murmur heard.    No friction rub. No gallop.  Pulmonary:     Effort: Pulmonary effort is normal. No respiratory distress.     Breath sounds: Normal breath sounds. No stridor. No wheezing, rhonchi or rales.  Abdominal:     General: Bowel sounds are normal. There is no distension.     Palpations: Abdomen is soft.  Tenderness: There is no abdominal tenderness. There is no guarding or rebound.  Musculoskeletal:        General: No swelling, tenderness or deformity.     Cervical back: Normal range of motion and neck supple. No rigidity or tenderness.     Right lower leg: No edema.     Left lower leg: No edema.  Lymphadenopathy:     Head:     Right side of head: No submental, submandibular, tonsillar, preauricular, posterior auricular or occipital adenopathy.     Left side of head: No submental, submandibular, tonsillar, preauricular, posterior auricular or occipital adenopathy.     Cervical: No cervical adenopathy.     Right cervical: No superficial, deep or posterior cervical adenopathy.    Left cervical: No superficial, deep or posterior cervical adenopathy.     Upper Body:     Right upper body: No supraclavicular, axillary, pectoral or epitrochlear adenopathy.     Left upper body: No supraclavicular, axillary, pectoral or epitrochlear adenopathy.  Skin:    General: Skin is warm.     Coloration: Skin is not jaundiced or pale.     Findings: No bruising or erythema.  Neurological:     General: No focal deficit present.     Mental Status: She is alert and oriented to person, place, and time.     Cranial Nerves: No cranial nerve deficit.     Motor: No  weakness.     Gait: Gait normal.  Psychiatric:        Mood and Affect: Mood normal.        Behavior: Behavior normal.        Thought Content: Thought content normal.        Judgment: Judgment normal.      LABORATORY DATA: I have personally reviewed the data as listed:  Admission on 07/05/2022, Discharged on 07/05/2022  Component Date Value Ref Range Status   Sodium 07/05/2022 137  135 - 145 mmol/L Final   Potassium 07/05/2022 4.2  3.5 - 5.1 mmol/L Final   Chloride 07/05/2022 111  98 - 111 mmol/L Final   CO2 07/05/2022 22  22 - 32 mmol/L Final   Glucose, Bld 07/05/2022 95  70 - 99 mg/dL Final   Glucose reference range applies only to samples taken after fasting for at least 8 hours.   BUN 07/05/2022 8  6 - 20 mg/dL Final   Creatinine, Ser 07/05/2022 0.55  0.44 - 1.00 mg/dL Final   Calcium 07/05/2022 8.4 (L)  8.9 - 10.3 mg/dL Final   GFR, Estimated 07/05/2022 >60  >60 mL/min Final   Comment: (NOTE) Calculated using the CKD-EPI Creatinine Equation (2021)    Anion gap 07/05/2022 4 (L)  5 - 15 Final   Performed at [facility], [place] [address], [place], [place] [address]   WBC 07/05/2022 8.1  4.0 - 10.5 K/uL Final   RBC 07/05/2022 3.38 (L)  3.87 - 5.11 MIL/uL Final   Hemoglobin 07/05/2022 6.1 (LL)  12.0 - 15.0 g/dL Final   Comment: REPEATED TO VERIFY Reticulocyte Hemoglobin testing may be clinically indicated, consider ordering this additional test [id number] THIS CRITICAL RESULT HAS VERIFIED AND BEEN CALLED TO BARBER,M RN BY AMANDA LEONARD ON 12 04 2023 AT 1253, AND HAS BEEN READ BACK.     HCT 07/05/2022 23.4 (L)  36.0 - 46.0 % Final   MCV 07/05/2022 69.2 (L)  80.0 - 100.0 fL Final   MCH 07/05/2022 18.0 (L)  26.0 - 34.0  pg Final   MCHC 07/05/2022 26.1 (L)  30.0 - 36.0 g/dL Final   RDW 07/05/2022 23.9 (H)  11.5 - 15.5 % Final   Platelets 07/05/2022 812 (H)  150 - 400 K/uL Final   nRBC 07/05/2022 0.6 (H)  0.0 - 0.2 % Final   Neutrophils Relative % 07/05/2022 61  % Final    Neutro Abs 07/05/2022 4.9  1.7 - 7.7 K/uL Final   Lymphocytes Relative 07/05/2022 31  % Final   Lymphs Abs 07/05/2022 2.5  0.7 - 4.0 K/uL Final   Monocytes Relative 07/05/2022 6  % Final   Monocytes Absolute 07/05/2022 0.5  0.1 - 1.0 K/uL Final   Eosinophils Relative 07/05/2022 1  % Final   Eosinophils Absolute 07/05/2022 0.1  0.0 - 0.5 K/uL Final   Basophils Relative 07/05/2022 1  % Final   Basophils Absolute 07/05/2022 0.1  0.0 - 0.1 K/uL Final   Immature Granulocytes 07/05/2022 0  % Final   Abs Immature Granulocytes 07/05/2022 0.03  0.00 - 0.07 K/uL Final   Polychromasia 07/05/2022 PRESENT   Final   Giant PLTs 07/05/2022 PRESENT   Final   Performed at [facility], [place] [address], [place], [place] [address]   Prothrombin Time 07/05/2022 13.3  11.4 - 15.2 seconds Final   INR 07/05/2022 1.0  0.8 - 1.2 Final   Comment: (NOTE) INR goal varies based on device and disease states. Performed at [facility], [place] [address] [place], [place] [address]    ABO/RH(D) 07/05/2022 B POS   Final   Antibody Screen 07/05/2022 NEG   Final   Sample Expiration 07/05/2022 07/08/2022,2359   Final   Unit Number 07/05/2022 [id number]   Final   Blood Component Type 07/05/2022 RED CELLS,LR   Final   Unit division 07/05/2022 00   Final   Status of Unit 07/05/2022 [facility]   Final   Transfusion Status 07/05/2022 OK TO TRANSFUSE   Final   Crossmatch Result 07/05/2022    Final                   Value:Compatible Performed at [facility], [place] [address] [place], [place] [address]    Unit Number 07/05/2022 [id number]   Final   Blood Component Type 07/05/2022 RED CELLS,LR   Final   Unit division 07/05/2022 00   Final   Status of Unit 07/05/2022 ISSUED,FINAL   Final   Transfusion Status 07/05/2022 OK TO TRANSFUSE   Final   Crossmatch Result 07/05/2022 Compatible   Final   Troponin I (High Sensitivity) 07/05/2022 10  <18 ng/L Final   Comment: (NOTE) Elevated high sensitivity  troponin I (hsTnI) values and significant  changes across serial measurements may suggest ACS but many other  chronic and acute conditions are known to elevate hsTnI results.  Refer to the "Links" section for chest pain algorithms and additional  guidance. Performed at [facility], [place] [address] [place], [place] [address]    Vitamin B-12 07/05/2022 154 (L)  180 - 914 pg/mL Final   Comment: (NOTE) This assay is not validated for testing neonatal or myeloproliferative syndrome specimens for Vitamin B12 levels. Performed at [facility], [place] [address] [place], [place] [address]    Iron 07/05/2022 15 (L)  28 - 170 ug/dL Final   TIBC 07/05/2022 657 (H)  250 - 450 ug/dL Final   Saturation Ratios 07/05/2022 2 (L)  10.4 - 31.8 % Final  UIBC 07/05/2022 642  ug/dL Final   Performed at [facility], [place] [address] [place], [place] [address]   Ferritin 07/05/2022 3 (L)  11 - 307 ng/mL Final   Performed at [place] [address], [place], [place] [address]   Retic Ct Pct 07/05/2022 2.0  0.4 - 3.1 % Final   RBC. 07/05/2022 3.33 (L)  3.87 - 5.11 MIL/uL Final   Retic Count, Absolute 07/05/2022 65.3  19.0 - 186.0 K/uL Final   Immature Retic Fract 07/05/2022 28.1 (H)  2.3 - 15.9 % Final   Performed at [facility], [place] [address], [place], [place] [address]   Troponin I (High Sensitivity) 07/05/2022 10  <18 ng/L Final   Comment: (NOTE) Elevated high sensitivity troponin I (hsTnI) values and significant  changes across serial measurements may suggest ACS but many other  chronic and acute conditions are known to elevate hsTnI results.  Refer to the "Links" section for chest pain algorithms and additional  guidance. Performed at [facility], [place] [address], [place], [place] [address]    Order Confirmation 07/05/2022    Final                   Value:ORDER PROCESSED BY BLOOD BANK Performed at [facility], [place] [address] [place], [place]  [address]    ISSUE DATE / TIME 07/05/2022 [id number]   Final   Blood Product Unit Number 07/05/2022 [id number]   Final   PRODUCT CODE 07/05/2022 [id number]   Final   Unit Type and Rh 07/05/2022 7300   Final   Blood Product Expiration Date 07/05/2022 [id number]   Final   ISSUE DATE / TIME 07/05/2022 [id number]   Final   Blood Product Unit Number 07/05/2022 [id number]   Final   PRODUCT CODE 07/05/2022 [id number]   Final   Unit Type and Rh 07/05/2022 7300   Final   Blood Product Expiration Date 07/05/2022 [id number]   Final   Fecal Occult Bld 07/05/2022 NEGATIVE  NEGATIVE Final   Folate 07/05/2022 7.0  >5.9 ng/mL Final   Performed at [facility], [place] [address], [place], [place] [address]    RADIOGRAPHIC STUDIES: I have personally reviewed the radiological images as listed and agree with the findings in the report  No results found.  ASSESSMENT/PLAN  41 y.o. female with medical history notable for uterine fibroids.  Patient is seen for evaluation and management of anemia and history of pulmonary embolism  Anemia:  Most likely etiology is chronic blood loss secondary to dysfunctional uterine bleeding.  .  Will obtain CBC with diff, CMP, Ferritin, B12, folate, retic count,  DAT, Haptoglobin  Therapeutics:  Since patient has symptomatic anemia with Hgb < 10  and has not tolerated/been compliant with oral iron will arrange for IV iron replacement.   Given the severe symptoms we prefer to replete iron stores in one or two visits rather than over the course of several months.  In addition ongoing blood loss exceeds the capacity of oral iron to meet needs. A discussion regarding risks was had with the patient.  IV iron has the potential to cause allergic reactions, including potentially life-threatening anaphylaxis.   IV iron may be associated with non-allergic infusion reactions including self-limiting urticaria, palpitations, dizziness, and neck and back spasm; generally, these  occur in <1 percent of individuals and do not progress to more serious reactions. The non-allergic reaction consisting of flushing of the face  and myalgias of the chest and back.   After discussion of the risks and benefits of IV iron therapy patient has elected to proceed with parenteral iron therapy.    Dysfunctional uterine bleeding:  This is characterized by menses that are prolonged, irregular as well as by heavy bleeding with passage of clots.  Will evaluate for possible bleeding disorder with PT, PTT, Fibrinogen, von Willebrand screen.  Followed by gynecology.  Underwent endometrial ablation in the past which did not sufficiently remedy the problem.  Likely due to uterine fibroids.  Exacerbated by anticoagulation which prompted it to be discontinued.  To consider hysterectomy.    Unprovoked PE:  The only obvious risk factor for VTE in this patient is obesity.  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  Will begin hypercoagulable state evaluation.  Currently off anticoagulation.      Cancer Staging  No matching staging information was found for the patient.   No problem-specific Assessment & Plan notes found for this encounter.   No orders of the defined types were placed in this encounter.  65  minutes was spent in patient care.  This included time spent preparing to see the patient (e.g., review of tests and labs), reviewing separately obtained history, counseling and educating the patient, ordering medications, tests, or procedures; documenting clinical information in the electronic or other health record, independently interpreting results and communicating results to the patient as well as coordination of care.       All questions were answered. The patient knows to call the clinic with any problems, questions or concerns.  This note was electronically signed.    Barbee Cough, MD  07/17/2022 12:10 PM

## 2022-07-13 NOTE — Telephone Encounter (Signed)
Scheduled appointment per referral. Patient is aware of appointment date and time. Patient is aware to arrive 15 mins prior to appointment time and to bring updated insurance cards. Patient is aware of location.   

## 2022-07-17 ENCOUNTER — Inpatient Hospital Stay (HOSPITAL_BASED_OUTPATIENT_CLINIC_OR_DEPARTMENT_OTHER): Payer: BC Managed Care – PPO | Admitting: Oncology

## 2022-07-17 ENCOUNTER — Other Ambulatory Visit: Payer: Self-pay

## 2022-07-17 ENCOUNTER — Inpatient Hospital Stay: Payer: BC Managed Care – PPO | Attending: Oncology

## 2022-07-17 VITALS — BP 160/95 | HR 78 | Temp 97.9°F | Resp 17 | Ht 59.0 in | Wt 156.1 lb

## 2022-07-17 DIAGNOSIS — Z87891 Personal history of nicotine dependence: Secondary | ICD-10-CM | POA: Diagnosis not present

## 2022-07-17 DIAGNOSIS — Z7901 Long term (current) use of anticoagulants: Secondary | ICD-10-CM

## 2022-07-17 DIAGNOSIS — Z86711 Personal history of pulmonary embolism: Secondary | ICD-10-CM | POA: Insufficient documentation

## 2022-07-17 DIAGNOSIS — N938 Other specified abnormal uterine and vaginal bleeding: Secondary | ICD-10-CM

## 2022-07-17 DIAGNOSIS — E669 Obesity, unspecified: Secondary | ICD-10-CM

## 2022-07-17 DIAGNOSIS — I2699 Other pulmonary embolism without acute cor pulmonale: Secondary | ICD-10-CM

## 2022-07-17 DIAGNOSIS — Z86718 Personal history of other venous thrombosis and embolism: Secondary | ICD-10-CM

## 2022-07-17 DIAGNOSIS — D649 Anemia, unspecified: Secondary | ICD-10-CM | POA: Diagnosis present

## 2022-07-17 DIAGNOSIS — E611 Iron deficiency: Secondary | ICD-10-CM | POA: Insufficient documentation

## 2022-07-17 DIAGNOSIS — N920 Excessive and frequent menstruation with regular cycle: Secondary | ICD-10-CM

## 2022-07-17 LAB — CBC WITH DIFFERENTIAL (CANCER CENTER ONLY)
Abs Immature Granulocytes: 0.02 10*3/uL (ref 0.00–0.07)
Basophils Absolute: 0.1 10*3/uL (ref 0.0–0.1)
Basophils Relative: 1 %
Eosinophils Absolute: 0.1 10*3/uL (ref 0.0–0.5)
Eosinophils Relative: 1 %
HCT: 34.2 % — ABNORMAL LOW (ref 36.0–46.0)
Hemoglobin: 10 g/dL — ABNORMAL LOW (ref 12.0–15.0)
Immature Granulocytes: 0 %
Lymphocytes Relative: 28 %
Lymphs Abs: 2.3 10*3/uL (ref 0.7–4.0)
MCH: 22.3 pg — ABNORMAL LOW (ref 26.0–34.0)
MCHC: 29.2 g/dL — ABNORMAL LOW (ref 30.0–36.0)
MCV: 76.2 fL — ABNORMAL LOW (ref 80.0–100.0)
Monocytes Absolute: 0.4 10*3/uL (ref 0.1–1.0)
Monocytes Relative: 5 %
Neutro Abs: 5.2 10*3/uL (ref 1.7–7.7)
Neutrophils Relative %: 65 %
Platelet Count: 311 10*3/uL (ref 150–400)
RBC: 4.49 MIL/uL (ref 3.87–5.11)
RDW: 25.1 % — ABNORMAL HIGH (ref 11.5–15.5)
WBC Count: 8 10*3/uL (ref 4.0–10.5)
nRBC: 0 % (ref 0.0–0.2)

## 2022-07-17 LAB — VITAMIN B12: Vitamin B-12: 118 pg/mL — ABNORMAL LOW (ref 180–914)

## 2022-07-17 LAB — DIRECT ANTIGLOBULIN TEST (NOT AT ARMC)
DAT, IgG: NEGATIVE
DAT, complement: NEGATIVE

## 2022-07-17 LAB — CMP (CANCER CENTER ONLY)
ALT: 10 U/L (ref 0–44)
AST: 14 U/L — ABNORMAL LOW (ref 15–41)
Albumin: 4.1 g/dL (ref 3.5–5.0)
Alkaline Phosphatase: 45 U/L (ref 38–126)
Anion gap: 5 (ref 5–15)
BUN: 8 mg/dL (ref 6–20)
CO2: 27 mmol/L (ref 22–32)
Calcium: 8.9 mg/dL (ref 8.9–10.3)
Chloride: 107 mmol/L (ref 98–111)
Creatinine: 0.6 mg/dL (ref 0.44–1.00)
GFR, Estimated: >60 mL/min (ref >60)
Glucose, Bld: 93 mg/dL (ref 70–99)
Potassium: 4 mmol/L (ref 3.5–5.1)
Sodium: 139 mmol/L (ref 135–145)
Total Bilirubin: 0.3 mg/dL (ref 0.3–1.2)
Total Protein: 7.1 g/dL (ref 6.5–8.1)

## 2022-07-17 LAB — D-DIMER, QUANTITATIVE: D-Dimer, Quant: 0.34 ug/mL-FEU (ref 0.00–0.50)

## 2022-07-17 LAB — FOLATE: Folate: 5 ng/mL — ABNORMAL LOW (ref >5.9)

## 2022-07-17 LAB — PROTIME-INR
INR: 1 (ref 0.8–1.2)
Prothrombin Time: 13.3 seconds (ref 11.4–15.2)

## 2022-07-17 LAB — RETICULOCYTES
Immature Retic Fract: 14.2 % (ref 2.3–15.9)
RBC.: 4.5 MIL/uL (ref 3.87–5.11)
Retic Count, Absolute: 24.7 10*3/uL (ref 19.0–186.0)
Retic Ct Pct: 0.6 % (ref 0.4–3.1)

## 2022-07-17 LAB — APTT: aPTT: 25 seconds (ref 24–36)

## 2022-07-17 LAB — FERRITIN: Ferritin: 6 ng/mL — ABNORMAL LOW (ref 11–307)

## 2022-07-17 LAB — FIBRINOGEN: Fibrinogen: 304 mg/dL (ref 210–475)

## 2022-07-18 LAB — HAPTOGLOBIN: Haptoglobin: 105 mg/dL (ref 42–296)

## 2022-07-19 ENCOUNTER — Encounter: Payer: Self-pay | Admitting: Oncology

## 2022-07-19 LAB — BETA-2-GLYCOPROTEIN I ABS, IGG/M/A
Beta-2 Glyco I IgG: <9 GPI IgG units (ref 0–20)
Beta-2-Glycoprotein I IgA: <9 GPI IgA units (ref 0–25)
Beta-2-Glycoprotein I IgM: <9 GPI IgM units (ref 0–32)

## 2022-07-20 ENCOUNTER — Telehealth: Payer: Self-pay | Admitting: Oncology

## 2022-07-20 LAB — PROTEIN C DEFICIENCY PROFILE
Protein C Activity: 113 % (ref 73–180)
Protein C, Total: 102 % (ref 60–150)

## 2022-07-20 LAB — LUPUS ANTICOAGULANT PANEL
DRVVT: 43 s (ref 0.0–47.0)
PTT Lupus Anticoagulant: 31 s (ref 0.0–43.5)

## 2022-07-20 LAB — PROTEIN S PANEL
Protein S Activity: 61 % — ABNORMAL LOW (ref 63–140)
Protein S Ag, Free: 94 % (ref 61–136)
Protein S Ag, Total: 68 % (ref 60–150)

## 2022-07-20 LAB — THROMBIN TIME: Thrombin Time: 16.6 s (ref 0.0–23.0)

## 2022-07-20 NOTE — Telephone Encounter (Signed)
Spoke with patient confirming upcoming appointments  

## 2022-07-21 LAB — CARDIOLIPIN ANTIBODIES, IGM+IGG
Anticardiolipin IgG: <9 GPL U/mL (ref 0–14)
Anticardiolipin IgM: <9 MPL U/mL (ref 0–12)

## 2022-07-21 LAB — HGB FRACTIONATION CASCADE
Hgb A2: 2 % (ref 1.8–3.2)
Hgb A: 98 % (ref 96.4–98.8)
Hgb F: 0 % (ref 0.0–2.0)
Hgb S: 0 %

## 2022-07-27 ENCOUNTER — Inpatient Hospital Stay: Payer: BC Managed Care – PPO

## 2022-07-27 VITALS — BP 184/93 | HR 79 | Temp 98.4°F | Resp 17

## 2022-07-27 DIAGNOSIS — D509 Iron deficiency anemia, unspecified: Secondary | ICD-10-CM

## 2022-07-27 DIAGNOSIS — D649 Anemia, unspecified: Secondary | ICD-10-CM | POA: Diagnosis not present

## 2022-07-27 MED ORDER — SODIUM CHLORIDE 0.9 % IV SOLN: Freq: Once | INTRAVENOUS | Status: AC

## 2022-07-27 MED ORDER — SODIUM CHLORIDE 0.9 % IV SOLN
510.0000 mg | Freq: Once | INTRAVENOUS | Status: AC
  Administered 2022-07-27: 510 mg via INTRAVENOUS
  Filled 2022-07-27: qty 510

## 2022-07-27 MED ORDER — ACETAMINOPHEN 325 MG PO TABS
650.0000 mg | ORAL_TABLET | Freq: Once | ORAL | Status: AC
  Administered 2022-07-27: 650 mg via ORAL
  Filled 2022-07-27: qty 2

## 2022-07-27 MED ORDER — LORATADINE 10 MG PO TABS
10.0000 mg | ORAL_TABLET | Freq: Every day | ORAL | Status: DC
  Administered 2022-07-27: 10 mg via ORAL
  Filled 2022-07-27: qty 1

## 2022-07-27 NOTE — Patient Instructions (Signed)

## 2022-08-20 ENCOUNTER — Inpatient Hospital Stay: Payer: BC Managed Care – PPO | Attending: Oncology | Admitting: Oncology

## 2022-08-20 ENCOUNTER — Inpatient Hospital Stay: Payer: BC Managed Care – PPO

## 2022-08-20 ENCOUNTER — Other Ambulatory Visit: Payer: Self-pay

## 2022-08-20 ENCOUNTER — Encounter: Payer: Self-pay | Admitting: Oncology

## 2022-08-20 VITALS — BP 147/93 | HR 99 | Temp 97.3°F | Resp 20 | Wt 154.5 lb

## 2022-08-20 DIAGNOSIS — F5089 Other specified eating disorder: Secondary | ICD-10-CM | POA: Diagnosis not present

## 2022-08-20 DIAGNOSIS — N938 Other specified abnormal uterine and vaginal bleeding: Secondary | ICD-10-CM | POA: Diagnosis not present

## 2022-08-20 DIAGNOSIS — I2699 Other pulmonary embolism without acute cor pulmonale: Secondary | ICD-10-CM

## 2022-08-20 DIAGNOSIS — Z87891 Personal history of nicotine dependence: Secondary | ICD-10-CM | POA: Diagnosis not present

## 2022-08-20 DIAGNOSIS — D500 Iron deficiency anemia secondary to blood loss (chronic): Secondary | ICD-10-CM | POA: Diagnosis not present

## 2022-08-20 DIAGNOSIS — D649 Anemia, unspecified: Secondary | ICD-10-CM | POA: Insufficient documentation

## 2022-08-20 DIAGNOSIS — Z86711 Personal history of pulmonary embolism: Secondary | ICD-10-CM | POA: Insufficient documentation

## 2022-08-20 DIAGNOSIS — D259 Leiomyoma of uterus, unspecified: Secondary | ICD-10-CM | POA: Insufficient documentation

## 2022-08-20 DIAGNOSIS — Z86718 Personal history of other venous thrombosis and embolism: Secondary | ICD-10-CM | POA: Diagnosis not present

## 2022-08-20 DIAGNOSIS — E669 Obesity, unspecified: Secondary | ICD-10-CM | POA: Diagnosis not present

## 2022-08-20 LAB — CBC WITH DIFFERENTIAL/PLATELET
Abs Immature Granulocytes: 0.01 10*3/uL (ref 0.00–0.07)
Basophils Absolute: 0 10*3/uL (ref 0.0–0.1)
Basophils Relative: 1 %
Eosinophils Absolute: 0.1 10*3/uL (ref 0.0–0.5)
Eosinophils Relative: 1 %
HCT: 37.6 % (ref 36.0–46.0)
Hemoglobin: 12.1 g/dL (ref 12.0–15.0)
Immature Granulocytes: 0 %
Lymphocytes Relative: 15 %
Lymphs Abs: 0.8 10*3/uL (ref 0.7–4.0)
MCH: 27.9 pg (ref 26.0–34.0)
MCHC: 32.2 g/dL (ref 30.0–36.0)
MCV: 86.8 fL (ref 80.0–100.0)
Monocytes Absolute: 0.6 10*3/uL (ref 0.1–1.0)
Monocytes Relative: 12 %
Neutro Abs: 3.9 10*3/uL (ref 1.7–7.7)
Neutrophils Relative %: 71 %
Platelets: 208 10*3/uL (ref 150–400)
RBC: 4.33 MIL/uL (ref 3.87–5.11)
RDW: 27.3 % — ABNORMAL HIGH (ref 11.5–15.5)
WBC: 5.5 10*3/uL (ref 4.0–10.5)
nRBC: 0 % (ref 0.0–0.2)

## 2022-08-20 LAB — FERRITIN: Ferritin: 119 ng/mL (ref 11–307)

## 2022-08-20 NOTE — Progress Notes (Signed)
[facility] Follow up Visit:  Patient Care Team: Rikki Spearing, NP as PCP - General (Nurse Practitioner) Heath Lark, MD as Consulting Physician (Hematology and Oncology) Christophe Louis, MD as Consulting Physician (Obstetrics and Gynecology)  CHIEF COMPLAINTS/PURPOSE OF CONSULTATION:   HISTORY OF PRESENTING ILLNESS: Sherri Douglas 42 y.o. female is here because of history of VTE and anemia Medical history notable for uterine fibroids, DVT and pulmonary embolism  September 08, 2015: Transvaginal ultrasound demonstrated multiple fibroids in the uterus  April 05, 2022: CT PA showed small acute appearing right lower lobe pulmonary embolism.  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  Reticulocyte count 2% INR 1.0 Ferritin 3 folate 7 B12 154 Fecal call blood test negative. Received 2 units of packed red blood cells and discharged home  July 17 2022:  [facility] Hematology Consult  With regard to the PE, patient reports that she had sudden onset of SOB which occurred after sitting at the hair dresser.  Ultimately presented to the hospital where she was found to have a PE.  She already had a history of menorrhagia which was exacerbated by Eliquis.   Prior to the diagnosis of PE, she had not been ill, nor had she experienced swelling in extremities.  No recent surgeries.    Patient is G3 P3 Menopause  not reached.  Menses occur monthly last 5 days and are regular.  Bleeding is heavy. Does not have bleeding between periods.    Last pregnancy was 6 yrs ago.  Patient has uterine fibroids.   Has taken oral iron complicated by constipation.  Received IV iron October 2023 while hospitalized and received PRBC's then.    No reaction to IV iron.  Has a normal diet.  No history of postpartum hemorrhage requiring transfusion.  No history hemorrhage postoperatively requiring transfusion.   No hematochezia, melena, hemoptysis, hematuria.   No history of intra-articular or soft tissue bleeding.  No as history of abnormal bleeding in family members    Patient has symptoms of intolerance to cold, fatigue.  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    Has undergone endometrial ablation without improvement in uterine bleeding.   WBC 8.0 hemoglobin 10.0 MCV 76 platelet count 311; 65 segs 20 lymphs 5 monos 1 EO 1 basophil cell count 0.6%.  Hemoglobin electrophoresis normal adult pattern. Coombs test negative.  Haptoglobin 105 Ferritin 6 folate 5.0 B12 118 Fibrinogen 304 Beta-2 glycoprotein antibody negative anticardiolipin antibody negative.  Lupus anticoagulant screen negative INR 1.0 PTT 25 thrombin time 16.6 Protein C functional 113% protein S functional 61%  CMP notable for AST 14  July 21, 2022: Patient instructed to begin folic acid 1 mg daily  July 27, 2022: Feraheme 510 mg   August 20 2022:  Scheduled follow up for anemia and DVT .  Has not yet been scheduled for second dose of IV iron.  Feels better since receiving first  dose of IV iron.  No longer has ice pica.  Continues to have heavy vaginal bleeding off anticoagulation.  Has not yet undergone a hysterectomy due to financial concerns.    Review of Systems - Oncology  MEDICAL HISTORY: Past Medical History:  Diagnosis Date   Abnormal Pap smear    Anemia    Chlamydia    Gestational diabetes    Trichomoniasis of vagina 2015   Urinary tract infection      SURGICAL HISTORY: Past Surgical History:  Procedure Laterality Date   INDUCED ABORTION     WISDOM TOOTH EXTRACTION      SOCIAL HISTORY: Social History   Socioeconomic History   Marital status: Single    Spouse name: Not on file   Number of children: Not on file   Years of education: Not on file   Highest education level: Not on file  Occupational History   Not on file  Tobacco Use   Smoking status: Former   Smokeless tobacco: Never  Substance and Sexual Activity   Alcohol use: Yes    Comment: socially   Drug use: No   Sexual activity: Yes    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  Other Topics Concern   Not on file  Social History Narrative   Not on file   Social Determinants of Health   Financial Resource Strain: Not on file  Food Insecurity: Not on file  Transportation Needs: Not on file  Physical Activity: Not on file  Stress: Not on file  Social Connections: Not on file  Intimate Partner Violence: Not on file    FAMILY HISTORY Family History  Problem Relation Age of Onset   Diabetes Father    Hypertension Father    Diabetes Paternal Aunt    Hypertension Paternal Aunt    Diabetes Paternal Grandmother    Hypertension Paternal Grandmother    Diabetes Paternal Grandfather    Hypertension Paternal Grandfather    Anesthesia problems Neg Hx     ALLERGIES:  is allergic to eliquis [apixaban] and amoxicillin.  MEDICATIONS:  Current Outpatient Medications  Medication Sig Dispense Refill   Apixaban Starter Pack, '10mg'$  and '5mg'$ , (ELIQUIS DVT/PE STARTER PACK) Take 2 tablets ('10mg'$ ) twice daily for 7 days, then 1 tablet ('5mg'$ ) twice daily (Patient not taking: Reported on 10/15/2021) 74 tablet 2   doxycycline (VIBRAMYCIN) 100 MG capsule Take 1 capsule (100 mg total) by mouth 2 (two) times daily. (Patient not taking: Reported on 07/17/2022) 14 capsule 0   ibuprofen (ADVIL) 200 MG tablet Take 200-600 mg by mouth every 6 (six) hours as needed  for headache, cramping or mild pain.     iron polysaccharides (NIFEREX) 150 MG capsule Take 150 mg by mouth.     No current facility-administered medications for this visit.    PHYSICAL EXAMINATION:  ECOG PERFORMANCE STATUS: 1 - Symptomatic but completely ambulatory   There were no vitals filed for this visit.   There were no vitals filed for this visit.    Physical Exam Vitals and nursing note reviewed.  Constitutional:      General: She is not in acute distress.    Appearance: Normal appearance. She is obese. She is not ill-appearing, toxic-appearing or diaphoretic.     Comments: Here alone  HENT:     Head: Normocephalic and atraumatic.     Right Ear: External ear normal.     Left Ear: External ear normal.     Nose: Nose normal. No  congestion or rhinorrhea.  Eyes:     General: No scleral icterus.    Extraocular Movements: Extraocular movements intact.     Conjunctiva/sclera: Conjunctivae normal.     Pupils: Pupils are equal, round, and reactive to light.  Cardiovascular:     Rate and Rhythm: Normal rate and regular rhythm.     Heart sounds: Normal heart sounds. No murmur heard.    No friction rub. No gallop.  Pulmonary:     Effort: Pulmonary effort is normal. No respiratory distress.     Breath sounds: Normal breath sounds. No stridor. No wheezing, rhonchi or rales.  Abdominal:     General: Bowel sounds are normal. There is no distension.     Palpations: Abdomen is soft.     Tenderness: There is no abdominal tenderness. There is no guarding or rebound.  Musculoskeletal:        General: No swelling, tenderness or deformity.     Cervical back: Normal range of motion and neck supple. No rigidity or tenderness.     Right lower leg: No edema.     Left lower leg: No edema.  Lymphadenopathy:     Head:     Right side of head: No submental, submandibular, tonsillar, preauricular, posterior auricular or occipital adenopathy.     Left side of head: No submental,  submandibular, tonsillar, preauricular, posterior auricular or occipital adenopathy.     Cervical: No cervical adenopathy.     Right cervical: No superficial, deep or posterior cervical adenopathy.    Left cervical: No superficial, deep or posterior cervical adenopathy.     Upper Body:     Right upper body: No supraclavicular, axillary, pectoral or epitrochlear adenopathy.     Left upper body: No supraclavicular, axillary, pectoral or epitrochlear adenopathy.  Skin:    General: Skin is warm.     Coloration: Skin is not jaundiced or pale.     Findings: No bruising or erythema.  Neurological:     General: No focal deficit present.     Mental Status: She is alert and oriented to person, place, and time.     Cranial Nerves: No cranial nerve deficit.     Motor: No weakness.     Gait: Gait normal.  Psychiatric:        Mood and Affect: Mood normal.        Behavior: Behavior normal.        Thought Content: Thought content normal.        Judgment: Judgment normal.      LABORATORY DATA: I have personally reviewed the data as listed:  No visits with results within 1 Month(s) from this visit.  Latest known visit with results is:  Appointment on 07/17/2022  Component Date Value Ref Range Status   WBC Count 07/17/2022 8.0  4.0 - 10.5 K/uL Final   RBC 07/17/2022 4.49  3.87 - 5.11 MIL/uL Final   Hemoglobin 07/17/2022 10.0 (L)  12.0 - 15.0 g/dL Final   Comment: Reticulocyte Hemoglobin testing may be clinically indicated, consider ordering this additional test [id number]    HCT 07/17/2022 34.2 (L)  36.0 - 46.0 % Final   MCV 07/17/2022 76.2 (L)  80.0 - 100.0 fL Final   MCH 07/17/2022 22.3 (L)  26.0 - 34.0 pg Final   MCHC 07/17/2022 29.2 (L)  30.0 - 36.0 g/dL Final   RDW 07/17/2022 25.1 (H)  11.5 - 15.5 % Final   Platelet Count 07/17/2022 311  150 - 400 K/uL Final  nRBC 07/17/2022 0.0  0.0 - 0.2 % Final   Neutrophils Relative % 07/17/2022 65  % Final   Neutro Abs 07/17/2022 5.2  1.7 - 7.7  K/uL Final   Lymphocytes Relative 07/17/2022 28  % Final   Lymphs Abs 07/17/2022 2.3  0.7 - 4.0 K/uL Final   Monocytes Relative 07/17/2022 5  % Final   Monocytes Absolute 07/17/2022 0.4  0.1 - 1.0 K/uL Final   Eosinophils Relative 07/17/2022 1  % Final   Eosinophils Absolute 07/17/2022 0.1  0.0 - 0.5 K/uL Final   Basophils Relative 07/17/2022 1  % Final   Basophils Absolute 07/17/2022 0.1  0.0 - 0.1 K/uL Final   Immature Granulocytes 07/17/2022 0  % Final   Abs Immature Granulocytes 07/17/2022 0.02  0.00 - 0.07 K/uL Final   Performed at [facility], [place] [address], [place], [place] [address]   Sodium 07/17/2022 139  135 - 145 mmol/L Final   Potassium 07/17/2022 4.0  3.5 - 5.1 mmol/L Final   Chloride 07/17/2022 107  98 - 111 mmol/L Final   CO2 07/17/2022 27  22 - 32 mmol/L Final   Glucose, Bld 07/17/2022 93  70 - 99 mg/dL Final   Glucose reference range applies only to samples taken after fasting for at least 8 hours.   BUN 07/17/2022 8  6 - 20 mg/dL Final   Creatinine 07/17/2022 0.60  0.44 - 1.00 mg/dL Final   Calcium 07/17/2022 8.9  8.9 - 10.3 mg/dL Final   Total Protein 07/17/2022 7.1  6.5 - 8.1 g/dL Final   Albumin 07/17/2022 4.1  3.5 - 5.0 g/dL Final   AST 07/17/2022 14 (L)  15 - 41 U/L Final   ALT 07/17/2022 10  0 - 44 U/L Final   Alkaline Phosphatase 07/17/2022 45  38 - 126 U/L Final   Total Bilirubin 07/17/2022 0.3  0.3 - 1.2 mg/dL Final   GFR, Estimated 07/17/2022 >60  >60 mL/min Final   Comment: (NOTE) Calculated using the CKD-EPI Creatinine Equation (2021)    Anion gap 07/17/2022 5  5 - 15 Final   Performed at [facility], [place] [address] [place], [place] [address]   DAT, complement 07/17/2022 NEG   Final   DAT, IgG 07/17/2022    Final                   Value:NEG Performed at [facility], [place] [address] [place], [place] [address]    Ferritin 07/17/2022 6 (L)  11 - 307 ng/mL Final   Performed at  [place] [address], [place], [place] [address]   Folate 07/17/2022 5.0 (L)  >5.9 ng/mL Final   Performed at [place] [address], [place], [place] [address]   Haptoglobin 07/17/2022 105  42 - 296 mg/dL Final   Comment: (NOTE) Performed At: [facility] [place], [place] [id number] Rush Farmer MD [id number]    Hgb F 07/17/2022 0.0  0.0 - 2.0 % Final   Hgb A 07/17/2022 98.0  96.4 - 98.8 % Final   Hgb A2 07/17/2022 2.0  1.8 - 3.2 % Final   Hgb S 07/17/2022 0.0  0.0 % Final   Interpretation, Hgb Fract 07/17/2022 Comment   Final   Comment: (NOTE) Normal hemoglobin present; no hemoglobin variant or beta thalassemia identified. Note: Alpha thalassemia may not be detected by the Hgb Fractionation Cascade panel. If alpha thalassemia  is suspected, [facility] offers Alpha-Thalassemia DNA Analysis [phone]). Performed At: [facility] [place], [place] [id number] Rush Farmer MD [id number]    Retic Ct Pct 07/17/2022 0.6  0.4 - 3.1 % Final   RBC. 07/17/2022 4.50  3.87 - 5.11 MIL/uL Final   Retic Count, Absolute 07/17/2022 24.7  19.0 - 186.0 K/uL Final   Immature Retic Fract 07/17/2022 14.2  2.3 - 15.9 % Final   Performed at [facility], [place] [address] [place], [place] [address]   Vitamin B-12 07/17/2022 118 (L)  180 - 914 pg/mL Final   Comment: (NOTE) This assay is not validated for testing neonatal or myeloproliferative syndrome specimens for Vitamin B12 levels. Performed at [facility], [place] [address] [place], [place] [address]    D-Dimer, Quant 07/17/2022 0.34  0.00 - 0.50 ug/mL-FEU Final   Comment: (NOTE) At the manufacturer cut-off value of 0.5 g/mL FEU, this assay has a negative predictive value of 95-100%.This assay is intended for use in conjunction with a clinical pretest probability (PTP) assessment model to exclude  pulmonary embolism (PE) and deep venous thrombosis (DVT) in outpatients suspected of PE or DVT. Results should be correlated with clinical presentation. Performed at [facility], [place] [address] [place], [place] [address]    aPTT 07/17/2022 25  24 - 36 seconds Final   Performed at [facility], [place] [address] [place],  [address]   Prothrombin Time 07/17/2022 13.3  11.4 - 15.2 seconds Final   INR 07/17/2022 1.0  0.8 - 1.2 Final   Comment: (NOTE) INR goal varies based on device and disease states. Performed at [facility], [place] [address], [place],  [address]     RADIOGRAPHIC STUDIES: I have personally reviewed the radiological images as listed and agree with the findings in the report  No results found.  ASSESSMENT/PLAN  42 y.o. female with medical history notable for uterine fibroids.  Patient is seen for evaluation and management of anemia and history of pulmonary embolism  Anemia:  Most likely etiology is chronic blood loss secondary to dysfunctional uterine bleeding.  .    July 27 2022:  Received Feraheme 510 mg IV  August 20 2022:  Symptomatically improved.  Will arrange for second dose of IV iron   Dysfunctional uterine bleeding:  Followed by gynecology.  Underwent endometrial ablation in the past which did not sufficiently remedy the problem.  Likely due to uterine fibroids.  Exacerbated by anticoagulation which prompted it to be discontinued.  July 17 2022:  Screening tests in form of PT, PTT, Fibrinogen, PLT count normal August 20 2022:  Would like to proceed with hysterectomy but can not afford insurance copay.  I will ask if patient qualifies for an assistance program   Unprovoked PE:  The only obvious risk factor for VTE in this patient is obesity.  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   July 17 2022:  Hypercoagulable state evaluation off anticoagulation  unrevealing.      Patient will need prophylactic perioperative anticoagulation at time of hysterectomy    Cancer Staging  No matching staging information was found for the patient.   No problem-specific Assessment & Plan notes found for this encounter.   No orders of the defined types were placed in this encounter.  25  minutes was spent in patient care.  This included time spent preparing to see the patient (e.g., review  of tests and labs), reviewing separately obtained history, counseling and educating the patient, ordering medications, tests, or procedures; documenting clinical information in the electronic or other health record, independently interpreting results and communicating results to the patient as well as coordination of care.       All questions were answered. The patient knows to call the clinic with any problems, questions or concerns.  This note was electronically signed.    Barbee Cough, MD  08/20/2022 12:33 PM

## 2022-08-20 NOTE — Progress Notes (Signed)
Scheduling message sent for Feraheme x 1.  May need more infusions after today's labs.  Will refer for financial assistance due to high co-pay for hysterectomy, per Dr. Federico Flake.

## 2022-08-28 ENCOUNTER — Inpatient Hospital Stay: Payer: BC Managed Care – PPO

## 2022-08-28 VITALS — BP 157/94 | HR 75 | Temp 98.9°F | Resp 18

## 2022-08-28 DIAGNOSIS — D509 Iron deficiency anemia, unspecified: Secondary | ICD-10-CM

## 2022-08-28 DIAGNOSIS — D649 Anemia, unspecified: Secondary | ICD-10-CM | POA: Diagnosis not present

## 2022-08-28 MED ORDER — LORATADINE 10 MG PO TABS
10.0000 mg | ORAL_TABLET | Freq: Every day | ORAL | Status: DC
  Administered 2022-08-28: 10 mg via ORAL
  Filled 2022-08-28: qty 1

## 2022-08-28 MED ORDER — ACETAMINOPHEN 325 MG PO TABS
650.0000 mg | ORAL_TABLET | Freq: Once | ORAL | Status: AC
  Administered 2022-08-28: 650 mg via ORAL
  Filled 2022-08-28: qty 2

## 2022-08-28 MED ORDER — SODIUM CHLORIDE 0.9 % IV SOLN
510.0000 mg | Freq: Once | INTRAVENOUS | Status: AC
  Administered 2022-08-28: 510 mg via INTRAVENOUS
  Filled 2022-08-28: qty 510

## 2022-08-28 MED ORDER — SODIUM CHLORIDE 0.9 % IV SOLN: Freq: Once | INTRAVENOUS | Status: AC

## 2022-08-28 NOTE — Patient Instructions (Signed)

## 2022-10-22 ENCOUNTER — Inpatient Hospital Stay: Payer: BC Managed Care – PPO | Attending: Oncology | Admitting: Oncology

## 2022-10-22 NOTE — Progress Notes (Unsigned)
[facility] Follow up Visit:  Patient Care Team: Rikki Spearing, NP as PCP - General (Nurse Practitioner) Heath Lark, MD as Consulting Physician (Hematology and Oncology) Christophe Louis, MD as Consulting Physician (Obstetrics and Gynecology)  CHIEF COMPLAINTS/PURPOSE OF CONSULTATION:   HISTORY OF PRESENTING ILLNESS: Sherri Douglas 42 y.o. female is here because of history of VTE and anemia Medical history notable for uterine fibroids, DVT and pulmonary embolism  September 08, 2015: Transvaginal ultrasound demonstrated multiple fibroids in the uterus  April 05, 2022: CT PA showed small acute appearing right lower lobe pulmonary embolism.  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  Reticulocyte count 2% INR 1.0 Ferritin 3 folate 7 B12 154 Fecal call blood test negative. Received 2 units of packed red blood cells and discharged home  July 17 2022:  [facility] Hematology Consult  With regard to the PE, patient reports that she had sudden onset of SOB which occurred after sitting at the hair dresser.  Ultimately presented to the hospital where she was found to have a PE.  She already had a history of menorrhagia which was exacerbated by Eliquis.   Prior to the diagnosis of PE, she had not been ill, nor had she experienced swelling in extremities.  No recent surgeries.    Patient is G3 P3 Menopause  not reached.  Menses occur monthly last 5 days and are regular.  Bleeding is heavy. Does not have bleeding between periods.    Last pregnancy was 6 yrs ago.  Patient has uterine fibroids.   Has taken oral iron complicated by constipation.  Received IV iron October 2023 while hospitalized and received PRBC's then.    No reaction to IV iron.  Has a normal diet.  No history of postpartum hemorrhage requiring transfusion.  No history hemorrhage postoperatively requiring transfusion.   No hematochezia, melena, hemoptysis, hematuria.   No history of intra-articular or soft tissue bleeding.  No as history of abnormal bleeding in family members    Patient has symptoms of intolerance to cold, fatigue.  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    Has undergone endometrial ablation without improvement in uterine bleeding.   WBC 8.0 hemoglobin 10.0 MCV 76 platelet count 311; 65 segs 20 lymphs 5 monos 1 EO 1 basophil cell count 0.6%.  Hemoglobin electrophoresis normal adult pattern. Coombs test negative.  Haptoglobin 105 Ferritin 6 folate 5.0 B12 118 Fibrinogen 304 Beta-2 glycoprotein antibody negative anticardiolipin antibody negative.  Lupus anticoagulant screen negative INR 1.0 PTT 25 thrombin time 16.6 Protein C functional 113% protein S functional 61%  CMP notable for AST 14  July 21, 2022: Patient instructed to begin folic acid 1 mg daily  July 27, 2022: Feraheme 510 mg   August 20 2022:  Scheduled follow up for anemia and DVT .  Has not yet been scheduled for second dose of IV iron.  Feels better since receiving first  dose of IV iron.  No longer has ice pica.  Continues to have heavy vaginal bleeding off anticoagulation.  Has not yet undergone a hysterectomy due to financial concerns.    Review of Systems - Oncology  MEDICAL HISTORY: Past Medical History:  Diagnosis Date   Abnormal Pap smear    Anemia    Chlamydia    Gestational diabetes    Trichomoniasis of vagina 2015   Urinary tract infection      SURGICAL HISTORY: Past Surgical History:  Procedure Laterality Date   INDUCED ABORTION     WISDOM TOOTH EXTRACTION      SOCIAL HISTORY: Social History   Socioeconomic History   Marital status: Single    Spouse name: Not on file   Number of children: Not on file   Years of education: Not on file   Highest education level: Not on file  Occupational History   Not on file  Tobacco Use   Smoking status: Former   Smokeless tobacco: Never  Substance and Sexual Activity   Alcohol use: Yes    Comment: socially   Drug use: No   Sexual activity: Yes    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  Other Topics Concern   Not on file  Social History Narrative   Not on file   Social Determinants of Health   Financial Resource Strain: Not on file  Food Insecurity: Not on file  Transportation Needs: Not on file  Physical Activity: Not on file  Stress: Not on file  Social Connections: Not on file  Intimate Partner Violence: Not on file    FAMILY HISTORY Family History  Problem Relation Age of Onset   Diabetes Father    Hypertension Father    Diabetes Paternal Aunt    Hypertension Paternal Aunt    Diabetes Paternal Grandmother    Hypertension Paternal Grandmother    Diabetes Paternal Grandfather    Hypertension Paternal Grandfather    Anesthesia problems Neg Hx     ALLERGIES:  is allergic to eliquis [apixaban] and amoxicillin.  MEDICATIONS:  Current Outpatient Medications  Medication Sig Dispense Refill   ibuprofen (ADVIL) 200 MG tablet Take 200-600 mg by mouth every 6 (six) hours as needed for headache, cramping or mild pain.     iron polysaccharides (NIFEREX) 150 MG capsule Take 150 mg by mouth.     No current facility-administered medications for this visit.    PHYSICAL EXAMINATION:  ECOG PERFORMANCE STATUS: 1 - Symptomatic but completely ambulatory   There were no vitals filed for this visit.   There were no vitals filed for this  visit.    Physical Exam Vitals and nursing note reviewed.  Constitutional:      General: She is not in acute distress.    Appearance: Normal appearance. She is obese. She is not ill-appearing, toxic-appearing or diaphoretic.     Comments: Here alone  HENT:     Head: Normocephalic and atraumatic.     Right Ear: External ear normal.     Left Ear: External ear normal.     Nose: Nose normal. No congestion or rhinorrhea.  Eyes:     General: No scleral icterus.    Extraocular Movements: Extraocular movements intact.     Conjunctiva/sclera: Conjunctivae normal.     Pupils: Pupils are equal, round, and reactive to light.  Cardiovascular:     Rate and Rhythm: Normal rate and regular rhythm.     Heart sounds: Normal heart sounds.  No murmur heard.    No friction rub. No gallop.  Pulmonary:     Effort: Pulmonary effort is normal. No respiratory distress.     Breath sounds: Normal breath sounds. No stridor. No wheezing, rhonchi or rales.  Abdominal:     General: Bowel sounds are normal. There is no distension.     Palpations: Abdomen is soft.     Tenderness: There is no abdominal tenderness. There is no guarding or rebound.  Musculoskeletal:        General: No swelling, tenderness or deformity.     Cervical back: Normal range of motion and neck supple. No rigidity or tenderness.     Right lower leg: No edema.     Left lower leg: No edema.  Lymphadenopathy:     Head:     Right side of head: No submental, submandibular, tonsillar, preauricular, posterior auricular or occipital adenopathy.     Left side of head: No submental, submandibular, tonsillar, preauricular, posterior auricular or occipital adenopathy.     Cervical: No cervical adenopathy.     Right cervical: No superficial, deep or posterior cervical adenopathy.    Left cervical: No superficial, deep or posterior cervical adenopathy.     Upper Body:     Right upper body: No supraclavicular, axillary, pectoral or epitrochlear  adenopathy.     Left upper body: No supraclavicular, axillary, pectoral or epitrochlear adenopathy.  Skin:    General: Skin is warm.     Coloration: Skin is not jaundiced or pale.     Findings: No bruising or erythema.  Neurological:     General: No focal deficit present.     Mental Status: She is alert and oriented to person, place, and time.     Cranial Nerves: No cranial nerve deficit.     Motor: No weakness.     Gait: Gait normal.  Psychiatric:        Mood and Affect: Mood normal.        Behavior: Behavior normal.        Thought Content: Thought content normal.        Judgment: Judgment normal.      LABORATORY DATA: I have personally reviewed the data as listed:  No visits with results within 1 Month(s) from this visit.  Latest known visit with results is:  Appointment on 08/20/2022  Component Date Value Ref Range Status   Ferritin 08/20/2022 119  11 - 307 ng/mL Final   Performed at [facility], [address] [place], [place] [address]    RADIOGRAPHIC STUDIES: I have personally reviewed the radiological images as listed and agree with the findings in the report  No results found.  ASSESSMENT/PLAN  42 y.o. female with medical history notable for uterine fibroids.  Patient is seen for evaluation and management of anemia and history of pulmonary embolism  Anemia:  Most likely etiology is chronic blood loss secondary to dysfunctional uterine bleeding.  .    July 27 2022:  Received Feraheme 510 mg IV  August 20 2022:  Symptomatically improved.  Will arrange for second dose of IV iron   Dysfunctional uterine bleeding:  Followed by gynecology.  Underwent endometrial ablation in the past which did not sufficiently remedy the problem.  Likely due to uterine fibroids.  Exacerbated by anticoagulation which prompted it to be discontinued.  July 17 2022:  Screening tests in form of PT, PTT, Fibrinogen, PLT count normal August 20 2022:  Would  like to proceed with  hysterectomy but can not afford insurance copay.  I will ask if patient qualifies for an assistance program   Unprovoked PE:  The only obvious risk factor for VTE in this patient is obesity.  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   July 17 2022:  Hypercoagulable state evaluation off anticoagulation unrevealing.      Patient will need prophylactic perioperative anticoagulation at time of hysterectomy    Cancer Staging  No matching staging information was found for the patient.   No problem-specific Assessment & Plan notes found for this encounter.   No orders of the defined types were placed in this encounter.  25  minutes was spent in patient care.  This included time spent preparing to see the patient (e.g., review of tests and labs), reviewing separately obtained history, counseling and educating the patient, ordering medications, tests, or procedures; documenting clinical information in the electronic or other health record, independently interpreting results and communicating results to the patient as well as coordination of care.       All questions were answered. The patient knows to call the clinic with any problems, questions or concerns.  This note was electronically signed.    Barbee Cough, MD  10/22/2022 1:18 PM

## 2023-03-15 ENCOUNTER — Emergency Department (HOSPITAL_COMMUNITY)
Admission: EM | Admit: 2023-03-15 | Discharge: 2023-03-15 | Disposition: A | Discharge status: Home / Self Care | Payer: BC Managed Care – PPO | Attending: Emergency Medicine | Admitting: Emergency Medicine

## 2023-03-15 ENCOUNTER — Emergency Department (HOSPITAL_COMMUNITY): Payer: BC Managed Care – PPO

## 2023-03-15 ENCOUNTER — Encounter (HOSPITAL_COMMUNITY): Payer: Self-pay | Admitting: Emergency Medicine

## 2023-03-15 ENCOUNTER — Other Ambulatory Visit: Payer: Self-pay

## 2023-03-15 DIAGNOSIS — R079 Chest pain, unspecified: Secondary | ICD-10-CM | POA: Diagnosis present

## 2023-03-15 DIAGNOSIS — R0789 Other chest pain: Secondary | ICD-10-CM | POA: Insufficient documentation

## 2023-03-15 LAB — BASIC METABOLIC PANEL
Anion gap: 8 (ref 5–15)
BUN: 8 mg/dL (ref 6–20)
CO2: 22 mmol/L (ref 22–32)
Calcium: 9 mg/dL (ref 8.9–10.3)
Chloride: 107 mmol/L (ref 98–111)
Creatinine, Ser: 0.77 mg/dL (ref 0.44–1.00)
GFR, Estimated: >60 mL/min (ref >60)
Glucose, Bld: 105 mg/dL — ABNORMAL HIGH (ref 70–99)
Potassium: 3.9 mmol/L (ref 3.5–5.1)
Sodium: 137 mmol/L (ref 135–145)

## 2023-03-15 LAB — TROPONIN I (HIGH SENSITIVITY)
Troponin I (High Sensitivity): 9 ng/L (ref <18)
Troponin I (High Sensitivity): 8 ng/L (ref <18)

## 2023-03-15 LAB — CBC
HCT: 31.5 % — ABNORMAL LOW (ref 36.0–46.0)
Hemoglobin: 9.3 g/dL — ABNORMAL LOW (ref 12.0–15.0)
MCH: 23.1 pg — ABNORMAL LOW (ref 26.0–34.0)
MCHC: 29.5 g/dL — ABNORMAL LOW (ref 30.0–36.0)
MCV: 78.4 fL — ABNORMAL LOW (ref 80.0–100.0)
Platelets: 427 10*3/uL — ABNORMAL HIGH (ref 150–400)
RBC: 4.02 MIL/uL (ref 3.87–5.11)
RDW: 16.2 % — ABNORMAL HIGH (ref 11.5–15.5)
WBC: 10.4 10*3/uL (ref 4.0–10.5)
nRBC: 0 % (ref 0.0–0.2)

## 2023-03-15 LAB — HCG, SERUM, QUALITATIVE: Preg, Serum: NEGATIVE

## 2023-03-15 MED ORDER — PANTOPRAZOLE SODIUM 40 MG IV SOLR
40.0000 mg | Freq: Once | INTRAVENOUS | Status: AC
  Administered 2023-03-15: 40 mg via INTRAVENOUS
  Filled 2023-03-15: qty 10

## 2023-03-15 MED ORDER — LIDOCAINE VISCOUS HCL 2 % MT SOLN
15.0000 mL | Freq: Once | OROMUCOSAL | Status: AC
  Administered 2023-03-15: 15 mL via OROMUCOSAL
  Filled 2023-03-15: qty 15

## 2023-03-15 MED ORDER — IOHEXOL 350 MG/ML SOLN
75.0000 mL | Freq: Once | INTRAVENOUS | Status: AC | PRN
  Administered 2023-03-15: 75 mL via INTRAVENOUS

## 2023-03-15 MED ORDER — ALUM & MAG HYDROXIDE-SIMETH 200-200-20 MG/5ML PO SUSP
30.0000 mL | Freq: Once | ORAL | Status: AC
  Administered 2023-03-15: 30 mL via ORAL
  Filled 2023-03-15: qty 30

## 2023-03-15 NOTE — ED Triage Notes (Addendum)
Pt in with central cp since yesterday morning, began as burning pain and now pt states it feels more like chest pressure. Pt states pain is worse when lying flat, reports she is feeling some sob. Denies any n/v. Hx of DVTs, has stopped taking Eliquis due to negative side effects

## 2023-03-15 NOTE — Discharge Instructions (Signed)
Your imaging today did not show any acute findings.  Your lab work was unremarkable.  If you experience any worsening symptoms please return to the emergency department.  You were given a referral to ENT if the sensation of "something stuck" continues please schedule an appointment with them.

## 2023-03-15 NOTE — ED Provider Notes (Signed)
[place] EMERGENCY DEPARTMENT AT [facility] Provider Note   CSN: [id number] Arrival date & time: 03/15/23  0630     History DVT not anticoagulated.  Chief Complaint  Patient presents with   Chest Pain    Sherri Douglas is a 42 y.o. female.  42 y.o female with a PMH of DVT noncompliance with anticoagulation presents to the ED with a chief complaint of central chest pain which began yesterday while she was at work.  She feels a sensation of "something stuck in her throat ", reports it is exacerbated with any oral intake whether it is liquid or solid.  Her last intake prior to feeling this pain was steak and eggs, he does not report any prior history of food impaction.  She has taken Nexium, Tums without any improvement in symptoms.  Feels that the pain is exacerbated when she is also lying flat, feels like "I have to catch my breath ".  She does not have any prior history of CAD, no family history of CAD, no tobacco use.  The history is provided by the patient.  Chest Pain Associated symptoms: shortness of breath   Associated symptoms: no abdominal pain, no back pain, no fever, no nausea, no palpitations and no vomiting        Home Medications Prior to Admission medications   Medication Sig Start Date End Date Taking? Authorizing Provider  ibuprofen (ADVIL) 200 MG tablet Take 200-600 mg by mouth every 6 (six) hours as needed for headache, cramping or mild pain.    [provider]  iron polysaccharides (NIFEREX) 150 MG capsule Take 150 mg by mouth. 07/05/22   [provider]      Allergies    Eliquis [apixaban] and Amoxicillin    Review of Systems   Review of Systems  Constitutional:  Negative for chills and fever.  HENT:  Negative for sore throat.   Respiratory:  Positive for shortness of breath.   Cardiovascular:  Positive for chest pain. Negative for palpitations and leg swelling.  Gastrointestinal:  Negative for abdominal pain, nausea and  vomiting.  Genitourinary:  Negative for flank pain.  Musculoskeletal:  Negative for back pain.  All other systems reviewed and are negative.   Physical Exam Updated Vital Signs BP (!) 165/101   Pulse 70   Temp 98 F (36.7 C)   Resp 18   Wt 70.1 kg   LMP 02/14/2023 (Exact Date)   SpO2 95%   BMI 31.21 kg/m  Physical Exam Vitals and nursing note reviewed.  Constitutional:      Appearance: She is well-developed.  HENT:     Head: Normocephalic and atraumatic.  Cardiovascular:     Rate and Rhythm: Normal rate.  Pulmonary:     Effort: Pulmonary effort is normal. No tachypnea.     Breath sounds: No wheezing or rhonchi.  Chest:     Chest wall: No tenderness.     Comments: No tenderness with palpation. Abdominal:     Palpations: Abdomen is soft.     Tenderness: There is no abdominal tenderness.  Musculoskeletal:     Cervical back: Normal range of motion and neck supple.     Right lower leg: No edema.     Left lower leg: No edema.     Comments: No bilateral pitting edema.  Skin:    General: Skin is warm and dry.  Neurological:     Mental Status: She is alert and oriented to person, place, and time.  ED Results / Procedures / Treatments   Labs (all labs ordered are listed, but only abnormal results are displayed) Labs Reviewed  BASIC METABOLIC PANEL - Abnormal; Notable for the following components:      Result Value   Glucose, Bld 105 (*)    All other components within normal limits  CBC - Abnormal; Notable for the following components:   Hemoglobin 9.3 (*)    HCT 31.5 (*)    MCV 78.4 (*)    MCH 23.1 (*)    MCHC 29.5 (*)    RDW 16.2 (*)    Platelets 427 (*)    All other components within normal limits  HCG, SERUM, QUALITATIVE  TROPONIN I (HIGH SENSITIVITY)  TROPONIN I (HIGH SENSITIVITY)    EKG None  Radiology CT Soft Tissue Neck W Contrast  Result Date: 03/15/2023 CLINICAL DATA:  Foreign body suspected. EXAM: CT NECK WITH CONTRAST TECHNIQUE:  Multidetector CT imaging of the neck was performed using the standard protocol following the bolus administration of intravenous contrast. RADIATION DOSE REDUCTION: This exam was performed according to the departmental dose-optimization program which includes automated exposure control, adjustment of the mA and/or kV according to patient size and/or use of iterative reconstruction technique. CONTRAST:  75mL OMNIPAQUE IOHEXOL 350 MG/ML SOLN COMPARISON:  None Available. FINDINGS: Pharynx and larynx: Normal. No mass or edema. No radiopaque foreign body. Salivary glands: No inflammation, mass, or stone. Thyroid: Normal. Lymph nodes: No suspicious cervical lymphadenopathy. Vascular: Unremarkable. Limited intracranial: Unremarkable. Visualized orbits: Unremarkable. Mastoids and visualized paranasal sinuses: Well aerated. Skeleton: Unremarkable. Upper chest: Unremarkable. Other: No radiopaque foreign body in the neck soft tissues. IMPRESSION: Unremarkable neck CT. No radiopaque foreign body in the airway or neck soft tissues. Electronically Signed   By: Orvan Falconer M.D.   On: 03/15/2023 10:50   CT Angio Chest PE W and/or Wo Contrast  Result Date: 03/15/2023 CLINICAL DATA:  Pulmonary embolus suspected EXAM: CT ANGIOGRAPHY CHEST WITH CONTRAST TECHNIQUE: Multidetector CT imaging of the chest was performed using the standard protocol during bolus administration of intravenous contrast. Multiplanar CT image reconstructions and MIPs were obtained to evaluate the vascular anatomy. RADIATION DOSE REDUCTION: This exam was performed according to the departmental dose-optimization program which includes automated exposure control, adjustment of the mA and/or kV according to patient size and/or use of iterative reconstruction technique. CONTRAST:  75mL OMNIPAQUE IOHEXOL 350 MG/ML SOLN COMPARISON:  Chest CTA dated October 15, 2021 FINDINGS: Cardiovascular: No evidence of pulmonary embolus. Normal heart size. No pericardial  effusion. Normal caliber thoracic aorta with no atherosclerotic disease. Mild coronary artery calcifications. Mediastinum/Nodes: Esophagus and thyroid are unremarkable. No enlarged lymph nodes seen in the chest. Lungs/Pleura: Central airways are patent. Mild right lower lobe atelectasis. No consolidation, pleural effusion or pneumothorax. Upper Abdomen: No acute abnormality. Musculoskeletal: No chest wall abnormality. No acute or significant osseous findings. Review of the MIP images confirms the above findings. IMPRESSION: 1. No evidence of pulmonary embolus or acute airspace opacity. 2. Mild coronary artery calcifications. Electronically Signed   By: Allegra Lai M.D.   On: 03/15/2023 10:38   DG Chest 2 View  Result Date: 03/15/2023 CLINICAL DATA:  Chest pain EXAM: CHEST - 2 VIEW COMPARISON:  07/05/2022 FINDINGS: Normal heart size and mediastinal contours. No acute infiltrate or edema. No effusion or pneumothorax. No acute osseous findings. IMPRESSION: No active cardiopulmonary disease. Electronically Signed   By: Tiburcio Pea M.D.   On: 03/15/2023 07:08    Procedures Procedures    Medications  Ordered in ED Medications  alum & mag hydroxide-simeth (MAALOX/MYLANTA) 200-200-20 MG/5ML suspension 30 mL (30 mLs Oral Given 03/15/23 0840)  iohexol (OMNIPAQUE) 350 MG/ML injection 75 mL (75 mLs Intravenous Contrast Given 03/15/23 0920)  pantoprazole (PROTONIX) injection 40 mg (40 mg Intravenous Given 03/15/23 1113)  lidocaine (XYLOCAINE) 2 % viscous mouth solution 15 mL (15 mLs Mouth/Throat Given 03/15/23 1113)    ED Course/ Medical Decision Making/ A&P                                 Medical Decision Making Amount and/or Complexity of Data Reviewed Labs: ordered. Radiology: ordered.  Risk OTC drugs. Prescription drug management.   This patient presents to the ED for concern of chest pain, this involves a number of treatment options, and is a complaint that carries with it a high risk of  complications and morbidity.  The differential diagnosis includes ACS, food impaction, versus reflux.  Co morbidities: Discussed in HPI   Brief History:  See HPI.   EMR reviewed including pt PMHx, past surgical history and past visits to ER.   See HPI for more details   Lab Tests:  I ordered and independently interpreted labs.  The pertinent results include:    CBC with no leukocytosis, hemoglobin slightly decreased than her baseline, however patient just finished her menstrual cycle.  BMP with no electrolyte derangement, creatinine levels within normal limits.  BUN is unremarkable.  Pregnancy test is negative.  Her first troponin is negative, will follow-up with delta as pain is still ongoing.  Imaging Studies:  Chest x-ray without any acute findings. IMPRESSION:  Unremarkable neck CT. No radiopaque foreign body in the airway or  neck soft tissues   CT Angio showed: IMPRESSION:  1. No evidence of pulmonary embolus or acute airspace opacity.  2. Mild coronary artery calcifications.   Cardiac Monitoring:  The patient was maintained on a cardiac monitor.  I personally viewed and interpreted the cardiac monitored which showed an underlying rhythm of: NSR 82 EKG non-ischemic   Medicines ordered:  I ordered medication including gi cocktail, protonix  for reflux, indigestion Reevaluation of the patient after these medicines showed that the patient improved but did not resolved I have reviewed the patients home medicines and have made adjustments as needed  Reevaluation:  After the interventions noted above I re-evaluated patient and found that they have :improved  Social Determinants of Health:  The patient's social determinants of health were a factor in the care of this patient  Problem List / ED Course:  Patient with a past medical DVT, not compliant with anticoagulation presents to the ED with chest pain, shortness of breath that have been ongoing for the past 24  hours.  Describing is a centralized pressure, radiating up her mouth, feeling the sensation that there is something "stuck ", and her esophagus.  Exacerbated with oral intake, along with lying flat.  No improvement despite Nexium and Tums.  On exam she is overall hemodynamically stable, does not appear in any distress, blood pressure is elevated consistent with likely pain.  Her blood work here is unremarkable, negative troponins will obtain delta as pain has been ongoing.  CBC does show some decrease in her hemoglobin, however she just finished her menstrual cycle and has a history of symptomatic anemia.  CMP without any electrolyte derangement. She is describing feeling more short of breath like "I have to catch my breath ",  concern for pulmonary embolism as she has had a prior DVT, and was on anticoagulants but discontinued these due to having heavy menstrual cycles.  He does not have any cardiac history, no risk factors, I have a lower suspicion for ACS.   Heart score 0-1 She was given Maalox, to help with her discomfort.  Concern for food impaction versus pulmonary embolism.  We discussed CT soft tissue neck versus CT angio chest. CT soft tissue neck without any foreign body present, she does report some improvement in symptoms after receiving the GI cocktail but no resolution in her symptoms.  Her CT angio chest did not show any signs of pulmonary embolism.  I did discuss with her restarting her Eliquis, but she would like to follow-up with GYN prior to restarting this.  No signs of pneumonia on her chest x-ray, no PE on her CT angio, soft tissue neck is negative for any foreign body.  We discussed appropriate follow-up with likely ENT.  She does not appear in any distress at this time, return precautions discussed at length.  Patient is hemodynamically stable for discharge.  Dispostion:  After consideration of the diagnostic results and the patients response to treatment, I feel that the patent would  benefit from follow up with ENT.    Portions of this note were generated with Scientist, clinical (histocompatibility and immunogenetics). Dictation errors may occur despite best attempts at proofreading.   Final Clinical Impression(s) / ED Diagnoses Final diagnoses:  Atypical chest pain    Rx / DC Orders ED Discharge Orders     None         Claude Manges, Cordelia Poche 03/15/23 1119    Gerhard Munch, MD 03/15/23 1454

## 2023-06-06 ENCOUNTER — Encounter: Payer: Self-pay | Admitting: Oncology

## 2023-08-01 ENCOUNTER — Encounter: Payer: Self-pay | Admitting: Oncology

## 2023-09-03 ENCOUNTER — Other Ambulatory Visit: Payer: Self-pay | Admitting: Pharmacist
